# Patient Record
Sex: Male | Born: 1945 | Race: White | Hispanic: No | Marital: Married
Health system: Midwestern US, Academic
[De-identification: ages and names within clinical notes are randomized; demographics above are authoritative.]

---

## 2019-01-04 ENCOUNTER — Encounter: Admit: 2019-01-04 | Discharge: 2019-01-05

## 2019-01-12 ENCOUNTER — Encounter: Admit: 2019-01-12 | Discharge: 2019-01-13

## 2019-01-13 ENCOUNTER — Encounter: Admit: 2019-01-13 | Discharge: 2019-01-14

## 2019-01-24 ENCOUNTER — Encounter: Admit: 2019-01-24 | Discharge: 2019-01-24

## 2019-01-25 ENCOUNTER — Encounter: Admit: 2019-01-25 | Discharge: 2019-01-25

## 2019-01-25 DIAGNOSIS — I10 Essential (primary) hypertension: Secondary | ICD-10-CM

## 2019-01-25 DIAGNOSIS — E785 Hyperlipidemia, unspecified: Secondary | ICD-10-CM

## 2019-01-25 NOTE — Progress Notes
H&E Slides of ACC # [id number], Path Date 01/17/2019, requested on 01/25/2019 from [facility].    Contact for outside path lab is [phone].     UPS shipping label faxed (fax # [phone]) with shipment tracking # [id number].

## 2019-01-25 NOTE — Progress Notes
New encounter opened for Care Everywhere records search.

## 2019-01-25 NOTE — Telephone Encounter
Heme/BMT Navigation Intake Assessment Document    Patient Name:  Robert Williams  DOB:  10-Sep-1945    Date of Referral:  01/23/2019    Diagnosis & Reason for Visit:  Transplant/Treatment Options-Mantle cell lymphoma       [place]-MR:  [id number] APPOINTMENT:  Future Appointments   Date Time Provider Department Center   01/27/2019  8:00 AM Paulene Floor, Phillis Knack, MD [facility] Exam       REFERRING PHYSICIAN:  Al Pimple         FACILITY: [facility]   PHONE:  [phone]                INSURANCE:   Medicare    Allergies: NKA   Family Hx:     Medical Hx: HTN, HLD    Surgical Hx:    Social Hx:  Married  No tobacco history  Alcohol use    HPI:  Patient was seen for consult by ENT Dr Susann Givens 01/17/2019 for enlarged lymph nodes.  Patient had enlarged lymph nodes along the left side of his neck for 2-3 weeks duration.  FNA was performed that day and pathology revealed mantle cell lymphoma.    The patient was referred to [facility] in order to establish care.  Some records can be found in Care Everywhere.  Older records can be found in O2.  Progress note, labs and CT scan have been requested from the patient's PCP, Dr Herschell Dimes in [place].    Timeline of Events:   DATES       01/17/2019 US Impression:  Left cervical lymphadenopathy  Normal base of tongue and larynx   01/17/2019 Pathology-Lymph node left neck  Diagnosis:  Consistent with mantle cell lymphoma   Flow:  Monoclonal B-cells (83% of total cells) expressing CD5 and FMC7 without CD200     Location of Films:  CT scan reports requested 9/1.  Imaging request will be made when reports are received.    Location of Pathology:  MAILED/COURIER and Patient notified outside pathology slides will be obtained for review by [place] pathologist and a facility and professional fee will be billed to their insurance.    NEEDS Assessment:    Genetic Counseling:  Genetic Assessment: Not assessed at this time          Social & Financial: Social and Financial Assessment: Reports adequate support system          Spiritual & Emotional:  Spiritual and Emotional Assessment: Reports adequate support system       Physical:  Fall Risk: None identified       Communication:  Communication Barrier: No       Onc Fertility:   Onc Fertility Assessment: Not applicable       Additional Education:  Additional Education Documented: Yes      Patient Education  COVID-19 guidelines reviewed with patient, including: visitor and universal masking policies, and a temperature check at the facility entrance upon arrival.

## 2019-01-27 ENCOUNTER — Encounter: Admit: 2019-01-27 | Discharge: 2019-01-27

## 2019-01-27 DIAGNOSIS — C8311 Mantle cell lymphoma, lymph nodes of head, face, and neck: Principal | ICD-10-CM

## 2019-01-27 DIAGNOSIS — C61 Malignant neoplasm of prostate: Secondary | ICD-10-CM

## 2019-01-27 DIAGNOSIS — E785 Hyperlipidemia, unspecified: Secondary | ICD-10-CM

## 2019-01-27 DIAGNOSIS — C888 Other malignant immunoproliferative diseases: Secondary | ICD-10-CM

## 2019-01-27 DIAGNOSIS — I10 Essential (primary) hypertension: Secondary | ICD-10-CM

## 2019-01-27 DIAGNOSIS — Z114 Encounter for screening for human immunodeficiency virus [HIV]: Secondary | ICD-10-CM

## 2019-01-27 DIAGNOSIS — R791 Abnormal coagulation profile: Secondary | ICD-10-CM

## 2019-01-27 LAB — CBC AND DIFF
Lab: 0.1 10*3/uL (ref 0–0.20)
Lab: 0.1 10*3/uL (ref 0–0.45)
Lab: 0.6 10*3/uL (ref 0–0.80)
Lab: 1 % (ref 0–2)
Lab: 1 % (ref 0–5)
Lab: 1.1 10*3/uL (ref 1.0–4.8)
Lab: 13 % (ref 11–15)
Lab: 16 % — ABNORMAL LOW (ref >60)
Lab: 279 K/UL (ref 150–400)
Lab: 30 pg (ref 26–34)
Lab: 33 g/dL (ref 32.0–36.0)
Lab: 45 % (ref 40–50)
Lab: 5 M/UL (ref 4.4–5.5)
Lab: 5.2 10*3/uL (ref 1.8–7.0)
Lab: 7.1 10*3/uL (ref 4.5–11.0)
Lab: 73 % (ref 41–77)
Lab: 8 FL (ref 7–11)
Lab: 9 % (ref >60)
Lab: 90 FL (ref 80–100)

## 2019-01-27 LAB — HIV 1& 2 AG-AB SCRN W REFLEX HIV 1 PCR QUANT

## 2019-01-27 LAB — COMPREHENSIVE METABOLIC PANEL
Lab: 140 MMOL/L (ref 137–147)
Lab: 3.9 MMOL/L (ref 3.5–5.1)
Lab: 88 mg/dL (ref 70–100)

## 2019-01-27 LAB — FIBRINOGEN: Lab: 369 mg/dL (ref 200–400)

## 2019-01-27 LAB — HEPATITIS B CORE AB TOT (IGG+IGM)

## 2019-01-27 LAB — PHOSPHORUS: Lab: 2.8 mg/dL — ABNORMAL LOW (ref 2.0–4.5)

## 2019-01-27 LAB — HEPATITIS C ANTIBODY W REFLEX HCV PCR QUANT

## 2019-01-27 LAB — KAPPA/LAMBDA FREE LIGHT CHAINS
Lab: 1.9 mg/dL (ref 0.33–1.94)
Lab: 1.9 mg/dL (ref 0.57–2.63)

## 2019-01-27 LAB — IMMUNOGLOBULINS-IGA,IGG,IGM
Lab: 285 mg/dL — ABNORMAL HIGH (ref 70–390)
Lab: 973 mg/dL — ABNORMAL LOW (ref 762–1488)

## 2019-01-27 LAB — LDH-LACTATE DEHYDROGENASE: Lab: 157 U/L (ref 100–210)

## 2019-01-27 LAB — HEPATITIS B SURFACE AB: Lab: NEGATIVE 10*3/uL — ABNORMAL LOW (ref 1.0–4.8)

## 2019-01-27 LAB — PROTIME INR (PT): Lab: 1.1 K/UL (ref 0.8–1.2)

## 2019-01-27 LAB — PTT (APTT): Lab: 29 s (ref 24.0–36.5)

## 2019-01-27 LAB — D-DIMER: Lab: <21 ng{FEU}/mL (ref <500)

## 2019-01-27 LAB — HEPATITIS B SURFACE AG

## 2019-01-27 LAB — URIC ACID: Lab: 5.7 mg/dL (ref 4.0–8.0)

## 2019-01-27 MED ORDER — LIDOCAINE 1% (BUFFERED) SYR
5 mL | Freq: Once | INTRAMUSCULAR | 0 refills | Status: CN
Start: 2019-01-27 — End: ?

## 2019-01-27 MED ORDER — LORAZEPAM 0.5 MG PO TAB
.5-1 mg | Freq: Once | ORAL | 0 refills | Status: CN | PRN
Start: 2019-01-27 — End: ?

## 2019-01-27 NOTE — Progress Notes
Name: Robert Williams          MRN: [id number]      DOB: Jun 07, 1945      AGE: 73 y.o.   DATE OF SERVICE: 01/27/2019    Subjective:             Reason for Visit:  New CA Pt      Robert Williams is a 73 y.o. male.         Robert Williams presents today for management of lymphoma at the request of Dr. Susann Givens.    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  Presented August 2020 with worsening left supraclavicular adenopathy.  Initial chest x-ray imaging showed pathologically enlarged nodes.  2.  01/13/2019 CT scans of the neck and chest showed bilateral cervical and supraclavicular adenopathy.  3.  01/17/2019 FNA of supraclavicular node revealed CD5-positive monoclonal B-cell population consistent with mantle cell lymphoma.  FISH for t(11;14) was positive, confirming the diagnosis.    On interview today, he feels well.  He does not have any fevers, drenching night sweats, or unintentional weight loss.  He continues to work full-time and has excellent energy.  His significant lymphadenopathy in his neck is not tender or especially symptomatic but is bulky and visually obvious on physical exam.  He denies any other new or concerning symptoms today.    I have reviewed and updated the past medical, social and family histories in the history section and they are up to date as of this visit.    I have extensively reviewed the laboratory, pathology and radiology, both internal and external, and the key findings are summarized above.           Review of Systems   Hematological: Bruises/bleeds easily.   All other systems reviewed and are negative.        Objective:         ??? ascorbate sodium (SODIUM ASCORBATE MISC) Use 3 g as directed five times weekly.   ??? aspirin 325 mg tablet Take 1 Tab by mouth daily.   ??? atorvastatin (LIPITOR) 40 mg tablet Take 1 Tab by mouth daily.   ??? calcium carbonate/vitamin D3 (VITAMIN D-3 PO) Take 1,500 Units by mouth five times weekly.   ??? GARLIC PO Take  by mouth daily. ??? INOSITOL PO Take 400 mg by mouth daily.   ??? losartan-hydrochlorothiazide (HYZAAR) 50-12.5 mg tablet Take 1 tablet by mouth daily.   ??? SELENIUM PO Take 200 mg by mouth daily.   ??? TURMERIC PO Take  by mouth daily.   ??? UBIQUINONE PO Take 100 mg by mouth daily.   ??? valsartan/hydrochlorothiazide (DIOVAN HCT)  80/12.5 mg tablet Take  by mouth daily.     ??? VITAMIN K2 PO Take  by mouth daily.     Vitals:    01/27/19 0757 01/27/19 0758   BP: 131/65    BP Source: Arm, Left Upper    Patient Position: Sitting    Pulse: 80    Resp: 18    Temp: 36.9 ???C (98.5 ???F)    TempSrc: Oral Oral   SpO2: 97%    Weight: 102.5 kg (226 lb) 102.5 kg (226 lb)   Height: 183.5 cm (72.25) 183.5 cm (72.25)   PainSc: Zero Zero     Body mass index is 30.44 kg/m???.     Pain Score: Zero       Fatigue Scale: 0-None    Pain Addressed:  N/A    Patient Evaluated for a Clinical Trial: Patient currently in screening for a treatment clinical trial.     [place] Cooperative Oncology Group performance status is 0, Fully active, able to carry on all pre-disease performance without restriction.[name]     Physical Exam  Vitals signs reviewed.   Constitutional:       General: He is not in acute distress.  HENT:      Head: Normocephalic and atraumatic.   Eyes:      General: No scleral icterus.  Neck:      Musculoskeletal: Neck supple.   Cardiovascular:      Rate and Rhythm: Normal rate and regular rhythm.   Pulmonary:      Effort: Pulmonary effort is normal.      Breath sounds: Normal breath sounds.   Abdominal:      General: There is no distension.      Palpations: Abdomen is soft. There is no mass.   Musculoskeletal: Normal range of motion.   Lymphadenopathy:      Comments: No palpable cervical, supraclavicular, axillary or inguinal adenopathy.   Skin:     General: Skin is warm.      Findings: No rash.   Neurological:      General: No focal deficit present.      Mental Status: He is alert.   Psychiatric:         Mood and Affect: Mood normal. 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 Mantle cell lymphoma, pending definitive staging and management  2. 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 Hypertension  4. Hyperlipidemia  5. ECOG PS 0    Plan:  I had a detailed discussion with [name] and his wife today regarding the natural history, biology, staging, and management of mantle cell lymphoma.  We discussed that this is an uncommon subtype of B-cell non-Hodgkin lymphoma that has overlapping features of aggressive and indolent lymphoma.  While it tends to be externally responsive to treatment and frequently can be put into complete remission, there is a tendency towards recurrence and, absent allogenic stem cell transplant, is generally not considered to be a curable disease.  We additionally discussed with him that there is a small number of patients, perhaps 5-10%, who do not require urgent therapy for their mantle cell lymphoma and can safely be observed.  Depending on the results of his staging work-up, this will help inform whether or not he would be a candidate for observation.  In his particular case, he has bulky lymphadenopathy on exam and I believe that treatment is indicated.  In patients over 70 consolidative autologous stem cell transplant is generally not recommended and, given the effectiveness of CAR T-cell therapy, I would not recommend consolidative autologous stem cell transplant in his case.  Standard of care treatment would be with bendamustine and rituximab for 6 cycles followed by rituximab maintenance for 2 to 3 years.  We do have a clinical trial with a fully chemotherapy free regimen of ibrutinib and venetoclax [facility]) that he would qualify for.  We discussed risks, benefits, and logistics of trial enrollment and he is potentially interested.  We had him meet with the clinical trial coordinator for additional discussion today.    Check PET/CT  Check bone marrow biopsy with conventional cytogenetics, FISH for t(11;14) and deletion 17p, and  neck generation sequencing panel.  Check comprehensive baseline lymphoma labs  RTC with me after staging, or sooner should new or concerning symptoms develop.    I have discussed the diagnosis and treatment plan with the patient and he expresses understanding and wishes to proceed.

## 2019-01-30 ENCOUNTER — Encounter: Admit: 2019-01-30 | Discharge: 2019-01-30

## 2019-01-30 LAB — BETA 2 MICROGLOBULIN: Lab: 2.5 MMOL/L — ABNORMAL HIGH (ref 98–110)

## 2019-01-31 ENCOUNTER — Encounter: Admit: 2019-01-31 | Discharge: 2019-01-31

## 2019-01-31 DIAGNOSIS — C8311 Mantle cell lymphoma, lymph nodes of head, face, and neck: Secondary | ICD-10-CM

## 2019-01-31 LAB — ELECTROPHORESIS-SERUM PROTEIN
Lab: 60 % (ref 48–68)
Lab: 7.1 g/dL (ref 6.0–8.0)
Lab: 9.1 % (ref 5–15)

## 2019-01-31 LAB — IMMUNOFIXATION, SERUM (IFES)

## 2019-01-31 MED ORDER — DEXAMETHASONE 6 MG PO TAB
12 mg | Freq: Once | ORAL | 0 refills | Status: CN
Start: 2019-01-31 — End: ?

## 2019-01-31 MED ORDER — ONDANSETRON HCL 8 MG PO TAB
16 mg | Freq: Once | ORAL | 0 refills | Status: CN
Start: 2019-01-31 — End: ?

## 2019-01-31 MED ORDER — DIPHENHYDRAMINE HCL 25 MG PO CAP
25 mg | Freq: Once | ORAL | 0 refills | Status: CN
Start: 2019-01-31 — End: ?

## 2019-01-31 MED ORDER — BENDAMUSTINE (BENDEKA) IVPB
90 mg/m2 | Freq: Once | INTRAVENOUS | 0 refills | Status: CN
Start: 2019-01-31 — End: ?

## 2019-01-31 MED ORDER — RITUXIMAB-PVVR IVPB
500 mg/m2 | Freq: Once | INTRAVENOUS | 0 refills | Status: CN
Start: 2019-01-31 — End: ?

## 2019-01-31 MED ORDER — ACETAMINOPHEN 325 MG PO TAB
650 mg | Freq: Once | ORAL | 0 refills | Status: CN
Start: 2019-01-31 — End: ?

## 2019-01-31 MED ORDER — MEPERIDINE (PF) 25 MG/ML IJ SYRG
25 mg | INTRAVENOUS | 0 refills | Status: CN | PRN
Start: 2019-01-31 — End: ?

## 2019-02-02 ENCOUNTER — Encounter: Admit: 2019-02-02 | Discharge: 2019-02-02

## 2019-02-03 ENCOUNTER — Encounter: Admit: 2019-02-03 | Discharge: 2019-02-03

## 2019-02-03 ENCOUNTER — Ambulatory Visit: Admit: 2019-02-03 | Discharge: 2019-02-03

## 2019-02-03 DIAGNOSIS — C8311 Mantle cell lymphoma, lymph nodes of head, face, and neck: Secondary | ICD-10-CM

## 2019-02-03 DIAGNOSIS — Z1159 Encounter for screening for other viral diseases: Secondary | ICD-10-CM

## 2019-02-03 DIAGNOSIS — E785 Hyperlipidemia, unspecified: Secondary | ICD-10-CM

## 2019-02-03 DIAGNOSIS — I10 Essential (primary) hypertension: Secondary | ICD-10-CM

## 2019-02-03 DIAGNOSIS — C61 Malignant neoplasm of prostate: Secondary | ICD-10-CM

## 2019-02-03 MED ORDER — CEFAZOLIN INJ 1GM IVP
2 g | Freq: Once | INTRAVENOUS | 0 refills | Status: CN
Start: 2019-02-03 — End: ?

## 2019-02-03 MED ORDER — ONDANSETRON HCL 8 MG PO TAB
8 mg | ORAL_TABLET | ORAL | 3 refills | 8.00000 days | Status: DC | PRN
Start: 2019-02-03 — End: 2019-02-08

## 2019-02-03 NOTE — Progress Notes
Interventional Radiology Outpatient Scheduling Checklist      1.  Name of Procedure(s):   Bone Marrow Biopsy      2.  Date of Procedure:   02/06/2019      3.  Arrival Time:   0800      4.  Procedure Time:  0900      5.  Correct Procedural Room Assignment:  IR [facility] Room #6      6.  Blood Thinners Triaged and instructed per protocol: Y/N/NA:  NA  Confirmed accurate instructions sent to patient: Y/N:  NA       7.  Procedure Order Verified: Y/N:  Yes      9.  Patient instructed to have a driver: Y/N/NA:  Yes    10.  Patient instructed on NPO status: Y/N/NA:  Yes  Midnight and 0700  Confirmed accurate instructions sent to patient: Y/N:  Yes    11.  Specimen needed: Y/N/NA:  Yes   Verified Order placed: Y/N:  Yes    12.  Allergies Verified:  Y/N:  Yes    13.  Is there an Iodine Allergy: Y/N:  No  Does the Procedure Require contrast: Y/N:  NO   If so, was the IR- Contrast Allergy Pre-Procedure Medication protocol ordered: Y/NA:  NA    14.  Does the patient have labs according to IR Pre-procedure Laboratory Parameter policy: Y/N/NA:  Yes  If No, was the patient instructed to obtain labs prior to procedure: Y/N/NA:  NA     15.  Will the patient need to be admitted or have a possible admission: Y/N:  No  If yes, confirmed accurate instructions sent to patient: Y/N/NA:  NA     16.  Patient States Understanding: Y/N:  Yes    17.  History of OSA:  Y/N:  No  If yes, confirm request to bring CPAP sent to patient: Y/N/NA:  NA    18. Patient declines electronic procedure instructions: Y/N:  No

## 2019-02-03 NOTE — Patient Education
Dear Robert Williams,    Thank you for choosing [facility] Interventional Radiology for your procedure. Your appointment information is listed below:    Appointment Date: 02/06/2019  Appointment Time: 9AM  Arrival Time: 8AM  Location:     ? Main Campus: [address] [place], [place]  [address]  Parking: [address]    INTERVENTIONAL RADIOLOGY  PRE-PROCEDURE INSTRUCTIONS SEDATION    You are scheduled for a procedure in Interventional Radiology with procedural sedation.  Please follow these instructions and any direction from your Primary Care/Managing Physician.  If you have questions about your procedure or need to reschedule please call [phone].    Medication Instructions:   You may take the following medications with a small sip of water:  Continue taking your normal morning medications as directed.    Diet Instructions:  a. (8) hours Midnight before your procedure, stop your regular diet and start a clear liquid diet.  b. (6) hours before your procedure, discontinue tube feedings and chewing tobacco.  c. (2) hours 7AM before your procedure discontinue clear liquids.  You should have nothing by mouth. This includes GUM or CANDY.     Clear Liquid Diet    Water  Apple or White Grape Juice  Coffee or tea without cream   Tea  White Cranberry Juice  Chicken Bouillon or Broth (no noodles)  Soda Pop  Popsicles   Beef Bouillon or Broth (no noodles)    Day of Exam Instructions:  1. Bathe or shower with an antibacterial soap prior to your appointment.  2. If you have a history of Obstructive Sleep Apnea (OSA) bring your CPAP/BIPAP.   3. Bring a list of your current medications and the dosages.  4. Wear comfortable clothing and leave valuables at home.  5. Arrive (1) hour prior to your appointment.  This time will be spent registering, interviewing, assessing, educating and preparing you for the test.  ? You will be with [place] anywhere from 30 minutes to 6 hours after your exam depending on your procedure.  6. You may be sedated for the procedure. A responsible adult must drive you home (no Benedetto Goad, taxis or buses are allowed) and stay with you overnight. If you do not have a driver we will be unable to perform your procedure.   7. You will not be able to return to work or drive the same day if receiving sedation.

## 2019-02-03 NOTE — Progress Notes
Name: Robert Williams          MRN: [id number]      DOB: [birth year]      AGE: 72 y.o.   DATE OF SERVICE: 02/03/2019    Subjective:             Reason for Visit:  Follow Up      Robert Williams is a 73 y.o. male.     Cancer Staging  No matching staging information was found for the patient.    History of Present Illness  Robert Williams is a patient of Dr. Mikey Bussing with mantle cell lymphoma.  He is starting treatment with bendamustine and Rituxan on 9/23 and is here for education.    He is doing well with no new complaints.  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  The swelling in his neck is stable.  He is active.  No HEENT, cardiovascular, pulmonary, dermatology, GU or GI complaints.    1.  Presented August 2020 with worsening left supraclavicular adenopathy.  Initial chest x-ray imaging showed pathologically enlarged nodes.  2.  01/13/2019 CT scans of the neck and chest showed bilateral cervical and supraclavicular adenopathy.  3.  01/17/2019 FNA of supraclavicular node revealed CD5-positive monoclonal B-cell population consistent with mantle cell lymphoma.  FISH for t(11;14) was positive, confirming the diagnosis.  ???  He is with his wife.     Review of Systems      Objective:         ??? ascorbate sodium (SODIUM ASCORBATE MISC) Use 3 g as directed five times weekly.   ??? aspirin 325 mg tablet Take 1 Tab by mouth daily.   ??? atorvastatin (LIPITOR) 40 mg tablet Take 1 Tab by mouth daily.   ??? calcium carbonate/vitamin D3 (VITAMIN D-3 PO) Take 1,500 Units by mouth five times weekly.   ??? GARLIC PO Take  by mouth daily.   ??? INOSITOL PO Take 400 mg by mouth daily.   ??? losartan-hydrochlorothiazide (HYZAAR) 50-12.5 mg tablet Take 1 tablet by mouth daily.   ??? ondansetron (ZOFRAN) 8 mg tablet Take one tablet by mouth every 8 hours as needed for Nausea or Vomiting.   ??? SELENIUM PO Take 200 mg by mouth daily.   ??? TURMERIC PO Take  by mouth daily.   ??? UBIQUINONE PO Take 100 mg by mouth daily. ??? valsartan/hydrochlorothiazide (DIOVAN HCT)  80/12.5 mg tablet Take  by mouth daily.     ??? VITAMIN K2 PO Take  by mouth daily.     Vitals:    02/03/19 1021 02/03/19 1022   BP: 118/76    BP Source: Arm, Right Upper    Patient Position: Sitting    Pulse: 79    Resp: 20    Temp: 37.1 ???C (98.8 ???F)    TempSrc: Oral    SpO2: 98%    Weight: 102.1 kg (225 lb)    Height: 183.5 cm (72.25)    PainSc: Zero Zero     Body mass index is 30.3 kg/m???.     Pain Score: Zero         Pain Addressed:  N/A  [place] Cooperative Oncology Group performance status is 0, Fully active, able to carry on all pre-disease performance without restriction.[name]        Physical exam  Vital signs reviewed  Bulky adenopathy in neck.  CBC w/Diff    Lab Results   Component Value Date/Time    WBC 7.1 01/27/2019  09:14 AM    RBC 5.00 01/27/2019 09:14 AM    HGB 15.0 01/27/2019 09:14 AM    HCT 45.5 01/27/2019 09:14 AM    MCV 90.9 01/27/2019 09:14 AM    MCH 30.0 01/27/2019 09:14 AM    MCHC 33.0 01/27/2019 09:14 AM    RDW 13.9 01/27/2019 09:14 AM    PLTCT 279 01/27/2019 09:14 AM    MPV 8.0 01/27/2019 09:14 AM    Lab Results   Component Value Date/Time    NEUT 73 01/27/2019 09:14 AM    ANC 5.20 01/27/2019 09:14 AM    LYMA 16 (L) 01/27/2019 09:14 AM    ALC 1.10 01/27/2019 09:14 AM    MONA 9 01/27/2019 09:14 AM    AMC 0.60 01/27/2019 09:14 AM    EOSA 1 01/27/2019 09:14 AM    AEC 0.10 01/27/2019 09:14 AM    BASA 1 01/27/2019 09:14 AM    ABC 0.10 01/27/2019 09:14 AM             Assessment and Plan:          1. Mantle cell lymphoma.  He is due to have a PET scan, bone marrow biopsy and port placed.  He will start treatment after he sees Dr. Mikey Bussing on 9/23.  Logistics of treatment discussed and everything was answered to his satisfaction.  He will not take blood pressure medication Tuesday evening.  Zofran sent to pharmacy.  Consent signed.  2. He will stop extra supplements during treatment.  3. He will contact interventional radiology regarding bone marrow and Dr. Epifanio Lesches office regarding port placement.  They are anxious about scheduling because his wife is leaving town.  Orders were placed previously.  ???  ???     APP Chemotherapy Education    IV Chemotherapy: The following is a summary of the patient's IV Chemotherapy Education.    A thorough pre-assessment and teaching session explaining the mechanism of action, possible side effects, precautions and instructions regarding bendamustine and Rituxan for curative intent was conducted. The patient will initiate treatment today. The cycle will repeat every 28 days  Plan of administration was reviewed.      Both written and verbal information were given to the patient.    The planned course of treatment, anticipated benefits, material risks and potential side effects that may occur with this course of treatment were explained to the patient.  Side effects and their management were discussed in detail and include, but are not limited to:  Lowering of blood counts, fatigue, taste changes, headaches, reflux, nausea, vomiting, constipation, diarrhea, swelling, hypersensitivity reaction, rash, changes in electrolytes, dehydration, fever      Reproductive concerns were not discussed with the patient  Infertility risks were not applicable and therefore not discussed    Appropriate handling of body secretions and waste at home were reviewed as applicable.    Prescriptions for supportive medications including zofran were e-scripted to their pharmacy and discussed in detail how to take. Drug to drug interactions were reviewed as applicable.     The patient has received contact information for the clinic and was instructed on when and who to call.     The patient verbalized understanding, was given the opportunity to ask questions, and the consent form was signed.       This was a 60 minute face to face encounter with 60 minutes spent in counseling and coordination of care.

## 2019-02-03 NOTE — Telephone Encounter
Pt scheduled for port insert with Dr. Flo Shanks on Tuesday 02/14/19 at [facility].    Instructed use of anesthesia vs. local only would be determined the morning of surgery.  Instructions given, pt states understanding.      Due to upcoming scheduled procedure, the patient has been scheduled for COVID-19 Swab testing to be completed within 48 hours of scheduled procedure.     Appointment Information:  - Date: Saturday 02/11/19  - Time: 1010  Location: [facility]  [address]  [place], [place] [address]      Surgery:      Your surgery will be at the [facility].      Advanced care, conveniently located            Address  [address]  [place], [place] [address]   [phone]  Directions  Located close to [facility], the [address] is just [place] of [address] in [place], [place]. The main entrance to the hospital is on the south side of the building.    Helpful driving directions  From [id number]  ? Take [employer] from [phone].    ? Go north to [employer].   ? Turn left (west) approximately 1/10 of a mile.   ? Turn left (south) into the [address] (formerly [employer]).   ? At stop sign, turn left. Then take the second right (this will be the right turn after the speed bump).    ? The main entrance is located on the south side of the building. Park in any available space.   Parking  Plentiful complimentary parking is just steps away from the front door, located on the south side of the building. Convenient complimentary parking for [facility] patients is located at that facility.    You will need to check into admitting main desk the day of your surgery.  Admitting is located inside the main entrance to the building.  You will receive a call from the surgical team the day prior to your surgery after 2 pm, to inform you of what time you will need to arrive the day of your surgery. -Do not eat or drink (including gum, mints, candy, or chewing tobacco) anything after midnight-12 am the night before your surgery.  -The morning of your surgery brush your teeth and tongue, ok to rinse, but do not drink any water.  -You may take your medications with a sip of water if instructed by your physician to do so.  -Please do not take diabetic medication the morning of your procedure unless otherwise instructed.   -If you are taking blood thinning medications such as Warafin/Coumadin, Clopidogrel/Plavix, or aspirins, please stop taking as instructed prior to the procedure if authorized by your prescribing physician.   -You will need to have someone drive you home.       Please call with any questions or concerns.  Scheduling Bridgett Larsson) [phone]  Nurse line [facility] [place]) [phone]  Fax [phone]

## 2019-02-06 ENCOUNTER — Encounter: Admit: 2019-02-06 | Discharge: 2019-02-06 | Payer: MEDICARE

## 2019-02-06 ENCOUNTER — Ambulatory Visit: Admit: 2019-02-06 | Discharge: 2019-02-06 | Payer: MEDICARE

## 2019-02-06 LAB — CBC W/DIFF BM
Lab: 13 % (ref 11–15)
Lab: 14 g/dL (ref 13.5–16.5)
Lab: 248 10*3/uL (ref 150–400)
Lab: 30 pg (ref 26–34)
Lab: 34 g/dL (ref 32.0–36.0)
Lab: 4.6 M/UL (ref 4.4–5.5)
Lab: 41 % (ref 40–50)
Lab: 6.6 10*3/uL (ref 4.5–11.0)
Lab: 8.5 FL (ref 7–11)
Lab: 88 FL (ref 80–100)

## 2019-02-06 LAB — MANUAL DIFF
Lab: 1 % (ref 0–5)
Lab: 2 % (ref 0–2)
Lab: 22 % — ABNORMAL LOW (ref 24–44)
Lab: 67 % (ref 41–77)
Lab: 8 % (ref 4–12)

## 2019-02-06 MED ORDER — MIDAZOLAM 1 MG/ML IJ SOLN
1 mg | Freq: Once | INTRAVENOUS | 0 refills | Status: CP
Start: 2019-02-06 — End: ?
  Administered 2019-02-06: 14:00:00 1 mg via INTRAVENOUS

## 2019-02-06 MED ORDER — MIDAZOLAM 1 MG/ML IJ SOLN
0 refills | Status: CP
Start: 2019-02-06 — End: ?
  Administered 2019-02-06: 15:00:00 1 mg via INTRAVENOUS

## 2019-02-06 MED ORDER — FENTANYL CITRATE (PF) 50 MCG/ML IJ SOLN
0 refills | Status: CP
Start: 2019-02-06 — End: ?
  Administered 2019-02-06: 15:00:00 50 ug via INTRAVENOUS

## 2019-02-06 NOTE — Progress Notes
Sedation physician present in room.  Recent vitals and patient condition reviewed between sedating physician and nurse.  Reassessment completed.  Determination made to proceed with planned sedation.

## 2019-02-06 NOTE — H&P (View-Only)
Pre Procedure History and Physical/Sedation Plan      Procedure Date:  02/06/2019    Planned Procedure(s): Bone marrow biopsy with moderate sedation     Indication for exam: Access for chemotherapy  ________________________________________________________________    Chief Complaint:   Mantle cell lymphoma     Previous Anesthetic/Sedation History:  Reviewed     Code Status: Full Code     Allergies:  Patient has no known allergies.  Medications:  Scheduled Meds:Continuous Infusions:  PRN and Respiratory Meds:       Vital Signs:  Last Filed Vital Signs: 24 Hour Range           Pre-procedure anxiolysis plan: Midazolam  Sedation/Medication Plan: Fentanyl, Lidocaine and Midazolam  Personal history of sedation complications: Denies adverse event.   Family history of sedation complications: Denies adverse event.   Medications for Reversal: Naloxone and Flumazenil  Discussion/Reviews:  Physician has discussed risks and alternatives of this type of sedation and above planned procedures with patient    NPO Status: Acceptable  Airway:  airway assessment performed  Mallampati II (soft palate, uvula, fauces visible)  Head and Neck: no abnormalities noted  Mouth: no abnormalities noted   Anesthesia Classification:  ASA III (A patient with a severe systemic disease that limits activity, but is not incapacitating)  Pregnancy Status: N/A    Lab/Radiology/Other Diagnostic Tests  Labs:  Relevant labs reviewed    I have examined the patient, and there are no significant changes in their condition, from the previous H&P performed on 9/11.    Robert Honey, APRN-NP  Pager [phone]

## 2019-02-06 NOTE — Other
Immediate Post Procedure Note    Date:  02/06/2019                                         Attending Physician:   Cline Crock, MD  Performing Provider:  Karie Georges, MD    Consent:  Consent obtained from patient.  Time out performed: Consent obtained, correct patient verified, correct procedure verified, correct site verified, patient marked as necessary.  Pre/Post Procedure Diagnosis:  Mantle cell lymphoma  Indications:  Mantle cell lymphoma    Anesthesia: Local lidocaine with IV sedation fentanyl and versed  Procedure(s):  Bone marrow aspiration and biopsy  Findings:  Successful bone marrow aspiration and biopsy     Estimated Blood Loss:  None/Negligible  Specimen(s) Removed/Disposition:  Yes, sent to pathology  Complications: None  Patient Tolerated Procedure: Well  Post-Procedure Condition:  stable    Karie Georges, MD

## 2019-02-06 NOTE — Patient Instructions
Interventional Radiology-Discharge Instructions?  Bone Marrow Aspiration  Bone marrow is the major site of blood cell formation. A bone marrow specimen may be obtained by aspiration or needle biopsy.? Aspiration removes cells through a needle inserted into the marrow cavity of the bone.?? A biopsy removes a small, solid core of marrow tissue through the needle.  ?  Aspiration/Needle Biopsies helps with the diagnosis of leukemia, anemias, and other blood disorders.?  POST-PROCEDURE ACTIVITY:  ? A responsible adult must drive you home.? If you receive sedation or anesthesia, do not drive, operate heavy machinery or do anything that requires concentration for at least 24 hours.?  ? It is recommended that a responsible adult be with you until morning.  ? Keep moving to decrease hip soreness.  ? Avoid any strenuous activity for the next 2-3 days following the procedure.  POST-PROCEDURE SITE CARE:  ? Keep the area clean and dry for 24 hours  ? Be sure your hands are clean when touching near the site.  ? You will have a bandage over the site.? Keep this dry.? You may remove it in 24 hours.?  ? You may shower after the first 24 hours.?? You may replace the previous bandage with a Band-aide if desired.  ? Discomfort at site may occur for 2-3 days following the procedure.  ? You may apply ice to the area; 15-20 minutes.?? May repeat ice every 2 hours as needed.  ? Do not submerge the site underwater for one week (no tub bath, swimming, hot tub, etc.)  ? Do not use ointments, creams or powders on the puncture site.  DIET/MEDICATIONS:  ? You may resume your previous diet after the procedure.?  ? If you receive sedation or anesthesia, avoid any foods or beverages containing alcohol for at least 24 hours.  ? Please see the Medication Reconciliation sheet for instructions regarding resuming your home medications.  ? Drink plenty of fluids for the next 24-48 hours.  WHEN TO CALL THE DOCTOR: ? Bright red blood soaks the bandage.??  ? You have new or worsened pain.? Some soreness is to be expected.  ? You have new or worsened signs of infection such as:  ??????????????? -Redness or swelling  ??????????????? -Fever greater than 101?F  ?  You or your caregiver should call 911 for any severe bleeding, dizziness, shortness of breath or loss of consciousness.  ?  For any of the above symptoms or for problems or concerns related to the procedure,??call? [phone] for Monday-Friday 7-5.? After-hours and weekends, please call??[phone] and ask for the Interventional Radiology Resident on-call.

## 2019-02-06 NOTE — Progress Notes
Bone marrow tech present and provided CBC per this RN.

## 2019-02-07 ENCOUNTER — Encounter: Admit: 2019-02-07 | Discharge: 2019-02-07 | Payer: MEDICARE

## 2019-02-07 LAB — POC GLUCOSE: Lab: 113 mg/dL — ABNORMAL HIGH (ref 70–100)

## 2019-02-07 MED ORDER — RP DX F-18 FDG MCI
10 | Freq: Once | INTRAVENOUS | 0 refills | Status: CP
Start: 2019-02-07 — End: ?
  Administered 2019-02-07: 14:00:00 11.1 via INTRAVENOUS

## 2019-02-08 ENCOUNTER — Encounter: Admit: 2019-02-08 | Discharge: 2019-02-08 | Payer: MEDICARE

## 2019-02-08 ENCOUNTER — Ambulatory Visit: Admit: 2019-02-08 | Discharge: 2019-02-09 | Payer: MEDICARE

## 2019-02-08 NOTE — Progress Notes
Telehealth Visit Note    Date of Service: 02/08/2019    Subjective:      Obtained patient's verbal consent to treat them and their agreement to [facility] financial policy and NPP via this telehealth visit during the [facility]       Robert Williams is a 73 y.o. male.    History of Present Illness  Robert Williams is a very pleasant 73 yo male who presents for initial evaluation for placement of central venous access.  The patient has been diagnosed with mantel cell lymphoma and is scheduled to begin chemotherapy on 02/15/2019.  He is in need of central venous access port and has been referred to Dr. Elisabeth Most.  The patient denies any previous upper chest operations or previous ports in the past.  He has a history of prostate cancer and has undergone resection and radiation.      Medical History:   Diagnosis Date   ? Cancer of prostate [facility])    ? Hyperlipidemia 08/13/2011   ? Hypertension 08/13/2011     Surgical History:   Procedure Laterality Date   ? PROSTATE SURGERY  2003   ? COLONOSCOPY       Family History   Problem Relation Age of Onset   ? Heart Disease Father      Social History     Socioeconomic History   ? Marital status: Married     Spouse name: Not on file   ? Number of children: Not on file   ? Years of education: Not on file   ? Highest education level: Not on file   Occupational History   ? Not on file   Tobacco Use   ? Smoking status: Never Smoker   ? Smokeless tobacco: Never Used   Substance and Sexual Activity   ? Alcohol use: Never     Frequency: Never     Comment: occasionally   ? Drug use: No   ? Sexual activity: Not on file   Other Topics Concern   ? Not on file   Social History Narrative   ? Not on file              Review of Systems   Constitutional: Negative.    HENT: Negative.    Eyes: Negative.    Respiratory: Negative.    Cardiovascular: Negative.    Gastrointestinal: Negative.    Endocrine: Negative.    Genitourinary: Positive for flank pain. Musculoskeletal: Positive for back pain.   Skin: Negative.    Allergic/Immunologic: Negative.    Neurological: Negative.    Hematological: Negative.    Psychiatric/Behavioral: Negative.          Objective:         ? ascorbate sodium (SODIUM ASCORBATE MISC) Use 3 g as directed five times weekly.   ? aspirin 325 mg tablet Take 1 Tab by mouth daily.   ? atorvastatin (LIPITOR) 40 mg tablet Take 1 Tab by mouth daily.   ? calcium carbonate/vitamin D3 (VITAMIN D-3 PO) Take 1,500 Units by mouth five times weekly.   ? GARLIC PO Take  by mouth daily.   ? INOSITOL PO Take 400 mg by mouth daily.   ? losartan-hydrochlorothiazide (HYZAAR) 50-12.5 mg tablet Take 1 tablet by mouth daily.   ? ondansetron (ZOFRAN) 8 mg tablet Take one tablet by mouth every 8 hours as needed for Nausea or Vomiting.   ? SELENIUM PO Take 200 mg by mouth daily.   ?  TURMERIC PO Take  by mouth daily.   ? UBIQUINONE PO Take 100 mg by mouth daily.   ? valsartan/hydrochlorothiazide (DIOVAN HCT)  80/12.5 mg tablet Take  by mouth daily.     ? VITAMIN K2 PO Take  by mouth daily.     Vitals:    02/08/19 0902   Weight: 102.1 kg (225 lb)   Height: 183.5 cm (72.25)   PainSc: Zero     Body mass index is 30.3 kg/m?[name]     Telehealth Patient Reported Vitals     Row Name 02/08/19 0902                Pain Score  Zero              Physical Exam  General:  73 yo M in no acute distress  Lungs: Respirations sound even and unlabored, able to speak in sentences without SOB, no cough or dyspnea observed  Neurological: Alert and oriented x 3, speech is clear and fluent,   Psych: Mood and affect are normal       Assessment and Plan:  73 yo M with mantel cell lymphoma and is scheduled to begin chemotherapy on 02/15/2019.  He is in need of central venous access port and has been referred to Dr. Elisabeth Most    We will proceed with placement of LEFT chest port a cath    We spent 30 minutes on the phone going over past medical and surgical history, performing an exam and discussing educational materials in great detail.  The patient is scheduled to begin chemotherapy soon and is in need of central venous access port.  Risks vs benefits of port placement were discussed in detail including bleeding, possible infection, damage to blood vessel or nerves, and pneumothorax. The patient wishes to electively proceed.  Post operative instructions and education were provided today, and written instructions were given to him in her My Chart.  The patient was provided the opportunity to have all of his questions and concerns addressed to his satisfaction and we will proceed with port placement on 02/14/2019 at the [facility].  He will call our office in the meantime with any follow up questions or problems.  The patient was instructed to stop is 325 mg ASA today and will resume the day after surgery.    Luna Glasgow, APRN-BC  Pager [phone]    [facility]  Department of General Surgery  [facility]  [address]  [place], [place]  [address]                             24 minutes spent on this patient's encounter with counseling and coordination of care taking >50% of the visit.

## 2019-02-08 NOTE — Patient Instructions
Department of General Surgery  Discharge Instructions  Implanted Port Placement    An implanted port is a small intravenous access device placed completely under the skin through which you may receive chemotherapy, other medications, and or blood products.  It may also be used to draw blood.   The port consists of a small reservoir that is implanted  under the skin of your chest.  The reservoir is connected to a catheter that is placed in a ?tunnel? under the skin and ends in a large vein near the center of your chest.  You may also hear it referred to as a ?power port? or ?portacath.?  A ?power port? is a port that can be used for contrast injections for CT scans.  A ?power port? is also safe for MRIs should you need one.    POST-PROCEDURE ACTIVITY  ? A responsible adult must drive you home.  After receiving sedation or anesthesia, you should not drive, operate heavy machinery or do anything that requires concentration for at least 24 hours after receiving sedation.  ? It is recommended that a responsible adult be with you until morning  ? Do not lift more than 10 lbs and avoid any strenuous activity affecting the upper body such as pushing, pulling or straining for 7 days.    POST-PROCEDURE SITE CARE  ? Your incision will be closed from the inside out.  You will not have any sutures that will need to be removed on the outside.  There will be topical skin glue (Dermabond) on the outside.  This is the shiny, lavender color substance that you see over the incision.  This will come off on its own.  Do not pick at it unless it remains two weeks after your procedure, then you can remove it.   ? You may shower in 24 hours.  Let warm soapy water run over your incision, then pat it dry gently.  ? Do not submerge the area underwater for 2 weeks or until fully healed (no swimming, hot tubs, bath tubs, etc)  ? Be sure your hands are clean when touching the area.  ? Do not use ointments, creams or powders on the incision. DIET/MEDICATIONS  ? You may resume your previous diet after the procedure.  ? Please avoid any foods or beverages containing alcohol for at least 24 hours after the procedure due to sedation.  ? Please see your medication reconciliation sheet regarding medications to resume after procedure (this will be given to you the day of the procedure)    WHEN TO CALL THE DOCTOR  ? Bright red blood coming from the wound.  ? You have pain not relieved by the medication.  Some soreness at the site is to be expected.  You may use cool packs or heat to help relieve pain, but do not let the cool packs or heat have direct contact with the skin/incision.  ? You have signs or symptoms of infections such as:  o Chills, body aches, fever greater than 101?F  o Redness, swelling or warmth at  or around your incision  o Drainage or pus coming from the incision  o Increased tenderness around the incision  ? You have an opening of the edge of the incision  ? You have swelling or the face, neck, chest or arm on the side where the port was place.    For severe problems such as excessive bleeding, chest pain or shortness of breath, please call 911.  For the above problems or other concerns related to the procedure, please call [phone].    For appointment scheduling questions or concerns, please call [phone].

## 2019-02-11 ENCOUNTER — Encounter: Admit: 2019-02-11 | Discharge: 2019-02-12 | Payer: MEDICARE

## 2019-02-12 ENCOUNTER — Encounter: Admit: 2019-02-12 | Discharge: 2019-02-12 | Payer: MEDICARE

## 2019-02-13 ENCOUNTER — Encounter: Admit: 2019-02-13 | Discharge: 2019-02-13 | Payer: MEDICARE

## 2019-02-13 LAB — COVID-19 (SARS-COV-2) PCR

## 2019-02-13 MED ORDER — ONDANSETRON HCL 8 MG PO TAB
ORAL_TABLET | Freq: Three times a day (TID) | ORAL | 3 refills | 8.00000 days | Status: AC | PRN
Start: 2019-02-13 — End: ?

## 2019-02-14 ENCOUNTER — Ambulatory Visit: Admit: 2019-02-14 | Discharge: 2019-02-14 | Payer: MEDICARE

## 2019-02-14 ENCOUNTER — Encounter: Admit: 2019-02-14 | Discharge: 2019-02-14 | Payer: MEDICARE

## 2019-02-14 MED ORDER — LACTATED RINGERS IV SOLP
1000 mL | INTRAVENOUS | 0 refills | Status: DC
Start: 2019-02-14 — End: 2019-02-14
  Administered 2019-02-14: 15:00:00 1000 mL via INTRAVENOUS

## 2019-02-14 MED ORDER — LIDOCAINE-EPINEPHRINE 1 %-1:100,000 IJ SOLN
0 refills | Status: DC
Start: 2019-02-14 — End: 2019-02-14
  Administered 2019-02-14: 19:00:00 10 mL via INTRAMUSCULAR

## 2019-02-14 MED ORDER — PROPOFOL INJ 10 MG/ML IV VIAL
0 refills | Status: DC
Start: 2019-02-14 — End: 2019-02-14
  Administered 2019-02-14: 18:00:00 30 mg via INTRAVENOUS
  Administered 2019-02-14: 19:00:00 20 mg via INTRAVENOUS

## 2019-02-14 MED ORDER — CEFAZOLIN INJ 1GM IVP
2 g | Freq: Once | INTRAVENOUS | 0 refills | Status: CP
Start: 2019-02-14 — End: ?
  Administered 2019-02-14: 19:00:00 2 g via INTRAVENOUS

## 2019-02-14 MED ORDER — FENTANYL CITRATE (PF) 50 MCG/ML IJ SOLN
50 ug | INTRAVENOUS | 0 refills | Status: DC | PRN
Start: 2019-02-14 — End: 2019-02-14

## 2019-02-14 MED ORDER — ONDANSETRON HCL (PF) 4 MG/2 ML IJ SOLN
4 mg | Freq: Once | INTRAVENOUS | 0 refills | Status: DC | PRN
Start: 2019-02-14 — End: 2019-02-14

## 2019-02-14 MED ORDER — ONDANSETRON HCL (PF) 4 MG/2 ML IJ SOLN
0 refills | Status: DC
Start: 2019-02-14 — End: 2019-02-14
  Administered 2019-02-14: 19:00:00 4 mg via INTRAVENOUS

## 2019-02-14 MED ORDER — PROPOFOL 10 MG/ML IV EMUL 50 ML (INFUSION)(AM)(OR)
INTRAVENOUS | 0 refills | Status: DC
Start: 2019-02-14 — End: 2019-02-14
  Administered 2019-02-14: 18:00:00 130 ug/kg/min via INTRAVENOUS

## 2019-02-14 MED ORDER — HEPARIN 5,000 UNITS IN NS 500 ML IRR SOLN (OR)
0 refills | Status: DC
Start: 2019-02-14 — End: 2019-02-14
  Administered 2019-02-14 (×2): 4 mL

## 2019-02-14 MED ORDER — OXYCODONE 5 MG PO TAB
5 mg | ORAL_TABLET | ORAL | 0 refills | 6.00000 days | Status: DC | PRN
Start: 2019-02-14 — End: 2019-05-10

## 2019-02-14 MED ORDER — HALOPERIDOL LACTATE 5 MG/ML IJ SOLN
1 mg | Freq: Once | INTRAVENOUS | 0 refills | Status: DC | PRN
Start: 2019-02-14 — End: 2019-02-14

## 2019-02-14 MED ORDER — PROMETHAZINE 25 MG/ML IJ SOLN
6.25 mg | INTRAVENOUS | 0 refills | Status: DC | PRN
Start: 2019-02-14 — End: 2019-02-14

## 2019-02-14 MED ORDER — FENTANYL CITRATE (PF) 50 MCG/ML IJ SOLN
0 refills | Status: DC
Start: 2019-02-14 — End: 2019-02-14
  Administered 2019-02-14: 18:00:00 100 ug via INTRAVENOUS

## 2019-02-14 MED ORDER — OXYCODONE-ACETAMINOPHEN 5-325 MG PO TAB
1-2 | Freq: Once | ORAL | 0 refills | Status: DC | PRN
Start: 2019-02-14 — End: 2019-02-14

## 2019-02-14 MED ORDER — LIDOCAINE (PF) 10 MG/ML (1 %) IJ SOLN
.1-2 mL | INTRAMUSCULAR | 0 refills | Status: DC | PRN
Start: 2019-02-14 — End: 2019-02-14

## 2019-02-14 MED ORDER — FENTANYL CITRATE (PF) 50 MCG/ML IJ SOLN
25 ug | INTRAVENOUS | 0 refills | Status: DC | PRN
Start: 2019-02-14 — End: 2019-02-14

## 2019-02-14 MED ORDER — LIDOCAINE (PF) 200 MG/10 ML (2 %) IJ SYRG
0 refills | Status: DC
Start: 2019-02-14 — End: 2019-02-14
  Administered 2019-02-14: 18:00:00 40 mg via INTRAVENOUS

## 2019-02-14 MED ORDER — MEPERIDINE (PF) 25 MG/ML IJ SYRG
12.5 mg | INTRAVENOUS | 0 refills | Status: DC | PRN
Start: 2019-02-14 — End: 2019-02-14

## 2019-02-14 MED ORDER — MIDAZOLAM 1 MG/ML IJ SOLN
INTRAVENOUS | 0 refills | Status: DC
Start: 2019-02-14 — End: 2019-02-14
  Administered 2019-02-14: 18:00:00 2 mg via INTRAVENOUS

## 2019-02-14 NOTE — Anesthesia Post-Procedure Evaluation
Post-Anesthesia Evaluation    Name: Robert Williams      MRN: [id number]     DOB: [birth year]     Age: 73 y.o.     Sex: male   __________________________________________________________________________     Procedure Information     Anesthesia Start Date/Time:  02/14/19 1325    Procedures:       CHEST PLACEMENT OF PORT-A-CATH SINGLE-LUMEN 8 FRENCH (Left Chest)      FLUOROSCOPIC GUIDANCE CENTRAL VENOUS ACCESS DEVICE PLACEMENT (Left Chest)    Location:  [facility] OR 4 / [place] MAIN OR/PERIOP    Surgeon:  Elmarie Shiley., MD          Post-Anesthesia Vitals  BP: 108/69 (09/22 1430)  Temp: 36.8 C (98.2 F) (09/22 1409)  Pulse: 72 (09/22 1430)  Respirations: 19 PER MINUTE (09/22 1430)  SpO2: 94 % (09/22 1430)  SpO2 Pulse: 76 (09/22 1430)  Height: 182.9 cm (72") (09/22 1008)   Vitals Value Taken Time   BP 108/69 02/14/2019  2:30 PM   Temp 36.8 C (98.2 F) 02/14/2019  2:09 PM   Pulse 72 02/14/2019  2:30 PM   Respirations 19 PER MINUTE 02/14/2019  2:30 PM   SpO2 94 % 02/14/2019  2:30 PM         Post Anesthesia Evaluation Note    Evaluation location: pre/post  Patient participation: recovered; patient participated in evaluation  Level of consciousness: alert    Pain score: 4  Pain management: adequate    Hydration: normovolemia  Temperature: 36.0C - 38.4C  Airway patency: adequate    Perioperative Events       Post-op nausea and vomiting: no PONV    Postoperative Status  Cardiovascular status: hemodynamically stable  Respiratory status: spontaneous ventilation        Perioperative Events  Perioperative Event: No  Emergency Case Activation: No

## 2019-02-15 ENCOUNTER — Encounter: Admit: 2019-02-15 | Discharge: 2019-02-15 | Payer: MEDICARE

## 2019-02-15 LAB — CBC AND DIFF
Lab: 0.1 10*3/uL (ref 0–0.20)
Lab: 4.6 M/UL (ref 4.4–5.5)
Lab: 7.4 10*3/uL (ref 4.5–11.0)

## 2019-02-15 LAB — COMPREHENSIVE METABOLIC PANEL
Lab: 0.4 mg/dL (ref 0.3–1.2)
Lab: 103 MMOL/L — ABNORMAL LOW (ref >60)
Lab: 120 mg/dL — ABNORMAL HIGH (ref >60)
Lab: 13 U/L (ref 7–56)
Lab: 138 MMOL/L (ref 137–147)
Lab: 16 U/L (ref 7–40)
Lab: 17 mg/dL (ref 7–25)
Lab: 26 MMOL/L (ref 21–30)
Lab: 3.9 MMOL/L (ref 3.5–5.1)
Lab: 4.1 g/dL — ABNORMAL LOW (ref 3.5–5.0)
Lab: 69 U/L (ref 25–110)
Lab: 7.1 g/dL (ref 6.0–8.0)
Lab: 9 10*3/uL (ref 3–12)
Lab: 9.7 mg/dL (ref 8.5–10.6)
Lab: >60 mL/min (ref >60)
Lab: >60 mL/min (ref >60)

## 2019-02-15 MED ORDER — RITUXIMAB-PVVR IVPB
500 mg/m2 | Freq: Once | INTRAVENOUS | 0 refills | Status: CP
Start: 2019-02-15 — End: ?
  Administered 2019-02-15 (×3): 1100 mg via INTRAVENOUS

## 2019-02-15 MED ORDER — DEXAMETHASONE 6 MG PO TAB
12 mg | Freq: Once | ORAL | 0 refills | Status: CN
Start: 2019-02-15 — End: ?

## 2019-02-15 MED ORDER — RITUXIMAB-PVVR IVPB
500 mg/m2 | Freq: Once | INTRAVENOUS | 0 refills | Status: CN
Start: 2019-02-15 — End: ?

## 2019-02-15 MED ORDER — ONDANSETRON HCL 8 MG PO TAB
16 mg | Freq: Once | ORAL | 0 refills | Status: CN
Start: 2019-02-15 — End: ?

## 2019-02-15 MED ORDER — BENDAMUSTINE (BENDEKA) IVPB
90 mg/m2 | Freq: Once | INTRAVENOUS | 0 refills | Status: CN
Start: 2019-02-15 — End: ?

## 2019-02-15 MED ORDER — METHYLPREDNISOLONE SOD SUC(PF) 125 MG/2 ML IJ SOLR
125 mg | Freq: Once | INTRAVENOUS | 0 refills | Status: CP
Start: 2019-02-15 — End: ?
  Administered 2019-02-15: 14:00:00 125 mg via INTRAVENOUS

## 2019-02-15 MED ORDER — ACETAMINOPHEN 325 MG PO TAB
650 mg | Freq: Once | ORAL | 0 refills | Status: CN
Start: 2019-02-15 — End: ?

## 2019-02-15 MED ORDER — ACETAMINOPHEN 325 MG PO TAB
650 mg | Freq: Once | ORAL | 0 refills | Status: CP
Start: 2019-02-15 — End: ?
  Administered 2019-02-15: 14:00:00 650 mg via ORAL

## 2019-02-15 MED ORDER — BENDAMUSTINE (BENDEKA) IVPB
90 mg/m2 | Freq: Once | INTRAVENOUS | 0 refills | Status: CP
Start: 2019-02-15 — End: ?
  Administered 2019-02-15 (×2): 200 mg via INTRAVENOUS

## 2019-02-15 MED ORDER — MEPERIDINE (PF) 25 MG/ML IJ SYRG
25 mg | INTRAVENOUS | 0 refills | Status: DC | PRN
Start: 2019-02-15 — End: 2019-02-20

## 2019-02-15 MED ORDER — DIPHENHYDRAMINE HCL 25 MG PO CAP
25 mg | Freq: Once | ORAL | 0 refills | Status: CN
Start: 2019-02-15 — End: ?

## 2019-02-15 MED ORDER — ONDANSETRON HCL 8 MG PO TAB
16 mg | Freq: Once | ORAL | 0 refills | Status: CP
Start: 2019-02-15 — End: ?
  Administered 2019-02-15: 14:00:00 16 mg via ORAL

## 2019-02-15 MED ORDER — MONTELUKAST 10 MG PO TAB
10 mg | Freq: Once | ORAL | 0 refills | Status: CP
Start: 2019-02-15 — End: ?
  Administered 2019-02-15: 14:00:00 10 mg via ORAL

## 2019-02-15 MED ORDER — DIPHENHYDRAMINE HCL 50 MG/ML IJ SOLN
50 mg | Freq: Once | INTRAVENOUS | 0 refills | Status: CP
Start: 2019-02-15 — End: ?
  Administered 2019-02-15: 14:00:00 50 mg via INTRAVENOUS

## 2019-02-15 NOTE — Patient Instructions
Call Immediately to report the following:  Uncontrolled nausea and/or vomiting, uncontrolled pain, or unusual bleeding.  Temperature of 100.4 F or greater and/or any sign/symptom of infection (redness, warmth, tenderness)  Painful mouth or difficulty swallowing  Red, cracked, or painful hands and/or feet  Diarrhea   Swelling of arms or legs  Rash    Important Phone Numbers:  [facility] Main Number (answered 24 hours a day) [phone]  Cancer Center Scheduling (appointments) [phone] OR [phone]  [facility] (for nutritional supplements) [phone]        Port Maintenance - If you have a port, it should be flushed every 6-8 weeks when not in use.  Please check with your MD, nurse, or the scheduler.

## 2019-02-15 NOTE — Progress Notes
Cycle 1 Day 1 Rituximab Bendamustine    Patient had port place yesterday. Site appears red and inflamed.   Will continue to monitor but use peripheral IV today.  Patient verbalized understanding.    Rituxan titrated per protocol and tolerated without incident.  Bendeka given per plan.  Discharged in good condition, ambulatory.    CHEMO NOTE    Labs/applicable tests checked: CBC, CMP  Verified chemo consent signed and in chart.  BSA and dose double checked (agree with orders as written).    Arm band verified at bedside with second RN.  Premedications/Prehydration given as ordered.  Chemo drug/dose/route: see MAR  Rate verified with second RN.    Patient education offered and stated understanding.

## 2019-02-15 NOTE — Progress Notes
Name: Robert Williams          MRN: [id number]      DOB: 1946-05-10      AGE: 73 y.o.   DATE OF SERVICE: 02/15/2019    Subjective:             Reason for Visit:  Follow Up      Robert Williams is a 73 y.o. male.       Robert Williams presents today for management of lymphoma at the request of Dr. Susann Givens.    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  Presented August 2020 with worsening left supraclavicular adenopathy.  Initial chest x-ray imaging showed pathologically enlarged nodes.  2.  01/13/2019 CT scans of the neck and chest showed bilateral cervical and supraclavicular adenopathy.  3.  01/17/2019 FNA of supraclavicular node revealed CD5-positive monoclonal B-cell population consistent with mantle cell lymphoma.  FISH for t(11;14) was positive, confirming the diagnosis.  4.  02/07/2019 PET/CT revealed hypermetabolic adenopathy above and below the diaphragm with no extranodal sites of disease.  Staging bone marrow biopsy was negative.  5.  02/15/2019 BR started    Interim History:  Some erythema at his port site.  There is no significant pain.  He has not had any fevers.  Ready to get going with treatment.  He notes ongoing adenopathy in his neck that remains nontender but is clearly evident on inspection.    He continues to work full-time and has excellent energy.  No fevers, drenching night sweats, unintentional weight loss.  He denies any other new or concerning symptoms today.    I have reviewed and updated the past medical, social and family histories in the history section and they are up to date as of this visit.    I have extensively reviewed the laboratory, pathology and radiology, both internal and external, and the key findings are summarized above.           Review of Systems   All other systems reviewed and are negative.        Objective:         ? aspirin 325 mg tablet Take 1 Tab by mouth daily. ? atorvastatin (LIPITOR) 40 mg tablet Take 1 Tab by mouth daily. (Patient taking differently: Take 40 mg by mouth at bedtime daily.)   ? losartan-hydrochlorothiazide (HYZAAR) 50-12.5 mg tablet Take 1 tablet by mouth daily.   ? ondansetron (ZOFRAN) 8 mg tablet Take 1 tablet by mouth every 8 hours as needed for nasuea/vomitting.   ? oxyCODONE (ROXICODONE) 5 mg tablet Take one tablet by mouth every 4 hours as needed for Pain Indications: pain     Vitals:    02/15/19 0825   BP: 136/65   BP Source: Arm, Right Upper   Patient Position: Sitting   Pulse: 77   Resp: 18   Temp: 36.8 ?C (98.2 ?F)   TempSrc: Oral   SpO2: 98%   Weight: 102.9 kg (226 lb 12.8 oz)   Height: 182.9 cm (72)   PainSc: Zero     Body mass index is 30.76 kg/m?[name]     Pain Score: Zero       Fatigue Scale: 0-None    Pain Addressed:  N/A    Patient Evaluated for a Clinical Trial: Discussed clinical trial evaluation with patient and patient declines     [place] Cooperative Oncology Group performance status is 0, Fully active, able to  carry on all pre-disease performance without restriction.     Physical Exam  Vitals signs reviewed.   Constitutional:       General: He is not in acute distress.  HENT:      Head: Normocephalic and atraumatic.   Eyes:      General: No scleral icterus.  Neck:      Musculoskeletal: Neck supple.   Cardiovascular:      Rate and Rhythm: Normal rate and regular rhythm.   Pulmonary:      Effort: Pulmonary effort is normal.      Breath sounds: Normal breath sounds.   Abdominal:      General: There is no distension.      Palpations: Abdomen is soft. There is no mass.   Musculoskeletal: Normal range of motion.   Lymphadenopathy:      Comments:   Bulky bilateral cervical nodes   Skin:     General: Skin is warm.      Findings: No rash.   Neurological:      General: No focal deficit present.      Mental Status: He is alert.   Psychiatric:         Mood and Affect: Mood normal.               Assessment and Plan:      Problem Mantle Cell Lymphoma of Lymph Nodes of Neck (Hcc)    Impression:  1. Stage III mantle cell lymphoma  2. 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 Hypertension  4. Hyperlipidemia  5. ECOG PS 0    Plan:  Since last visit, there have been multiple events.  First, he had a PET/CT that I reviewed personally which reveals multifocal adenopathy above and below the diaphragm consistent with stage III disease.  Staging bone marrow biopsy was negative.  He will be treated per advanced age protocol as was thought at his initial visit.  Second, he had carefully considered enrollment in a clinical study and elected to go with standard of care with BR followed by rituximab maintenance.  He has had appropriate education, undergone port placement, and is prepared to initiate therapy today.    Start BR with C1D1 = 02/15/2019.  He will receive 6 cycles of therapy followed by 2 years of rituximab maintenance therapy.  His adenopathy is obvious on physical exam.  Assuming he has a clear clinical response, we will plan on repeating a PET/CT at completion of BR therapy.  Due to her history of lymphoma, he has an elevated risk of infections and secondary malignancies.  I advised him to have an annual influenza vaccine, remain up-to-date with pneumococcal vaccinations, and receive the new shingles vaccine, Shingrix.  He additionally should have aggressive age-appropriate cancer screening.  We will investigate his vaccination history and initiate any indicated pneumococcal vaccinations or shingles vaccines after he has some immune reconstitution after BR.  RTC with me in 2 weeks, or sooner should new or concerning symptoms develop.    I have discussed the diagnosis and treatment plan with the patient and he expresses understanding and wishes to proceed.

## 2019-02-16 ENCOUNTER — Encounter: Admit: 2019-02-16 | Discharge: 2019-02-16 | Payer: MEDICARE

## 2019-02-16 MED ORDER — ONDANSETRON HCL 8 MG PO TAB
16 mg | Freq: Once | ORAL | 0 refills | Status: CP
Start: 2019-02-16 — End: ?
  Administered 2019-02-16: 14:00:00 16 mg via ORAL

## 2019-02-16 MED ORDER — DEXAMETHASONE 6 MG PO TAB
12 mg | Freq: Once | ORAL | 0 refills | Status: CP
Start: 2019-02-16 — End: ?
  Administered 2019-02-16: 14:00:00 12 mg via ORAL

## 2019-02-16 MED ORDER — BENDAMUSTINE (BENDEKA) IVPB
90 mg/m2 | Freq: Once | INTRAVENOUS | 0 refills | Status: CP
Start: 2019-02-16 — End: ?
  Administered 2019-02-16 (×2): 200 mg via INTRAVENOUS

## 2019-02-16 NOTE — Progress Notes
Cycle 1 Day 2 Bendeka    Patient c/o constipation and will take Miralax when he gets home since stool softener hasn't worked yet.  Encouraged him to repeat tonight if no results.    Tx completed per plan.  Discharged to wife in good condition, ambulatory.    CHEMO NOTE    Labs/applicable tests checked: CBC, CMP  Verified chemo consent signed and in chart.  BSA and dose double checked (agree with orders as written).    Arm band verified at bedside with second RN.  Premedications/Prehydration given as ordered.  Chemo drug/dose/route: see MAR  Rate verified with second RN.    Patient education offered and stated understanding.

## 2019-02-20 ENCOUNTER — Encounter: Admit: 2019-02-20 | Discharge: 2019-05-05 | Payer: MEDICARE

## 2019-03-01 LAB — COMPREHENSIVE METABOLIC PANEL
Lab: 0.4 mg/dL — ABNORMAL HIGH (ref 0.3–1.2)
Lab: 0.8 mg/dL (ref 0.4–1.24)
Lab: 10 K/UL — ABNORMAL LOW (ref 3–12)
Lab: 103 MMOL/L (ref 98–110)
Lab: 103 mg/dL — ABNORMAL HIGH (ref 70–100)
Lab: 139 MMOL/L (ref 137–147)
Lab: 16 U/L (ref 7–40)
Lab: 16 U/L (ref 7–56)
Lab: 16 mg/dL (ref 7–25)
Lab: 26 MMOL/L (ref 21–30)
Lab: 4 MMOL/L (ref 3.5–5.1)
Lab: 4.1 g/dL — ABNORMAL LOW (ref 3.5–5.0)
Lab: 6.8 g/dL (ref 6.0–8.0)
Lab: 63 U/L (ref 25–110)
Lab: 9.8 mg/dL (ref 8.5–10.6)
Lab: >60 mL/min (ref >60)
Lab: >60 mL/min (ref >60)

## 2019-03-01 LAB — CBC AND DIFF
Lab: 0.1 10*3/uL (ref 0–0.20)
Lab: 4.7 M/UL (ref 4.4–5.5)
Lab: 6.7 10*3/uL (ref 4.5–11.0)

## 2019-03-01 NOTE — Progress Notes
Name: Robert Williams          MRN: [id number]      DOB: 1946-02-08      AGE: 73 y.o.   DATE OF SERVICE: 03/01/2019    Subjective:             Reason for Visit:  Follow Up      Robert Williams is a 73 y.o. male.     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 Susann Givens.    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  Presented August 2020 with worsening left supraclavicular adenopathy.  Initial chest x-ray imaging showed pathologically enlarged nodes.  2.  01/13/2019 CT scans of the neck and chest showed bilateral cervical and supraclavicular adenopathy.  3.  01/17/2019 FNA of supraclavicular node revealed CD5-positive monoclonal B-cell population consistent with mantle cell lymphoma.  FISH for t(11;14) was positive, confirming the diagnosis.  4.  02/07/2019 PET/CT revealed hypermetabolic adenopathy above and below the diaphragm with no extranodal sites of disease.  Staging bone marrow biopsy was negative.  5.  02/15/2019 BR started    Interim History:  Doing very well.  Nodes all smaller.  Some mild constipation days 4 and 5, resolved with MOM.  No nausea or vomiting.  No fevers or infections.  Continues to work full-time and has excellent energy.  No fevers, drenching night sweats, unintentional weight loss.    I have reviewed and updated the past medical, social and family histories in the history section and they are up to date as of this visit.    I have extensively reviewed the laboratory, pathology and radiology, both internal and external, and the key findings are summarized above.           Review of Systems   All other systems reviewed and are negative.        Objective: ? aspirin 325 mg tablet Take 1 Tab by mouth daily.   ? atorvastatin (LIPITOR) 40 mg tablet Take 1 Tab by mouth daily. (Patient taking differently: Take 40 mg by mouth at bedtime daily.)   ? losartan-hydrochlorothiazide (HYZAAR) 50-12.5 mg tablet Take 1 tablet by mouth daily.   ? ondansetron (ZOFRAN) 8 mg tablet Take 1 tablet by mouth every 8 hours as needed for nasuea/vomitting.   ? oxyCODONE (ROXICODONE) 5 mg tablet Take one tablet by mouth every 4 hours as needed for Pain Indications: pain     Vitals:    03/01/19 1014   BP: 125/68   BP Source: Arm, Left Upper   Patient Position: Sitting   Pulse: 75   Resp: 18   Temp: 36.7 ?C (98 ?F)   TempSrc: Oral   SpO2: 99%   Weight: 101.1 kg (222 lb 12.8 oz)   Height: 182.9 cm (72)   PainSc: Zero     Body mass index is 30.22 kg/m?[name]     Pain Score: Zero       Fatigue Scale: 0-None    Pain Addressed:  N/A    Patient Evaluated for a Clinical Trial: Patient not eligible for a treatment trial (including not needing treatment, needs  palliative care, in remission).     [place] Cooperative Oncology Group performance status is 0, Fully active, able to carry on all pre-disease performance without restriction.[name]     Physical Exam  Vitals signs reviewed.   Constitutional:       General: He is not in acute distress.  HENT:      Head: Normocephalic and atraumatic.   Eyes:      General: No scleral icterus.  Neck:      Musculoskeletal: Neck supple.   Cardiovascular:      Rate and Rhythm: Normal rate and regular rhythm.   Pulmonary:      Effort: Pulmonary effort is normal.      Breath sounds: Normal breath sounds.   Abdominal:      General: There is no distension.      Palpations: Abdomen is soft. There is no mass.   Musculoskeletal: Normal range of motion.   Lymphadenopathy:      Comments:   2cm left cervical node, smaller  Resolved right cervical nodes   Skin:     General: Skin is warm.      Findings: No rash.   Neurological:      General: No focal deficit present. Mental Status: He is alert.   Psychiatric:         Mood and Affect: Mood normal.               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 Stage III mantle cell lymphoma  2. 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 Hypertension  4. Hyperlipidemia  5. ECOG PS 0    Plan:  Responding very nicely to treatment with significant decline in his clinically palpable disease.  No significant toxicities thus far.  He did have some mild constipation which I believe is related to the ondansetron.  Recommended utilizing milk of magnesia prophylactically days 1 through 5 of treatment when he is taking higher doses of ondansetron.  Assuming no new issues, continue BR with C2D1 = 03/15/2019.   He will receive 6 cycles of therapy followed by 2 years of rituximab maintenance therapy.  His adenopathy is obvious on physical exam and he is responding nicely.  No need for midcycle imaging unless clinical evidence of treatment failure.  Repeat PET/CT at completion of BR.  Due to his history of lymphoma, he has an elevated risk of infections and secondary malignancies.  I advised him to have an annual influenza vaccine, remain up-to-date with pneumococcal vaccinations, and receive the new shingles vaccine, Shingrix.  He additionally should have aggressive age-appropriate cancer screening.  We will investigate his vaccination history and initiate any indicated pneumococcal vaccinations or shingles vaccines after he has some immune reconstitution after BR.  RTC with [name] in 2 weeks, with me in 6 weeks, or sooner should new or concerning symptoms develop.    I have discussed the diagnosis and treatment plan with the patient and he expresses understanding and wishes to proceed.

## 2019-03-12 ENCOUNTER — Encounter: Admit: 2019-03-12 | Discharge: 2019-03-13 | Payer: MEDICARE

## 2019-03-12 DIAGNOSIS — Z1159 Encounter for screening for other viral diseases: Secondary | ICD-10-CM

## 2019-03-13 LAB — COVID-19 (SARS-COV-2) PCR

## 2019-03-13 NOTE — Progress Notes
Name: Robert Williams          MRN: [id number]      DOB: 1946-03-09      AGE: 73 y.o.   DATE OF SERVICE: 03/15/2019    Subjective:             Reason for Visit:  Cancer Follow up      Robert Williams is a 73 y.o. male.     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 Mikey Bussing with mantle cell lymphoma.  He is here prior to cycle 2 of bendamustine and Rituxan.  ?  He is doing well with no new complaints.  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  The swelling in his neck is down and he can almost see his clavicle.  He is active.  No HEENT, cardiovascular, pulmonary, dermatology, GU or GI complaints. Needs dental cleaning but is postponing til after treatment.  ?  1. ?Presented August 2020 with worsening left supraclavicular adenopathy. ?Initial chest x-ray imaging showed pathologically enlarged nodes.  2. ?01/13/2019 CT scans of the neck and chest showed bilateral cervical and supraclavicular adenopathy.  3. ?01/17/2019 FNA of supraclavicular node revealed CD5-positive monoclonal B-cell population consistent with mantle cell lymphoma. ?FISH for t(11;14) was positive, confirming the diagnosis.  4.  02/07/2019 PET/CT revealed hypermetabolic adenopathy above and below the diaphragm with no extranodal sites of disease.  Staging bone marrow biopsy was negative.  5.  02/15/2019 BR started  He is with his wife.     Review of Systems      Objective:         ? aspirin 325 mg tablet Take 1 Tab by mouth daily.   ? atorvastatin (LIPITOR) 40 mg tablet Take 1 Tab by mouth daily. (Patient taking differently: Take 40 mg by mouth at bedtime daily.)   ? losartan-hydrochlorothiazide (HYZAAR) 50-12.5 mg tablet Take 1 tablet by mouth daily. ? ondansetron (ZOFRAN) 8 mg tablet Take 1 tablet by mouth every 8 hours as needed for nasuea/vomitting.   ? oxyCODONE (ROXICODONE) 5 mg tablet Take one tablet by mouth every 4 hours as needed for Pain Indications: pain     Vitals:    03/15/19 0845   BP: 129/65   BP Source: Arm, Left Upper   Patient Position: Sitting   Pulse: 79   Resp: 14   Temp: 36.6 ?C (97.8 ?F)   TempSrc: Oral   SpO2: 99%   Weight: 100.8 kg (222 lb 3.2 oz)   Height: 182.9 cm (72)   PainSc: Zero     Body mass index is 30.14 kg/m?[name]     Pain Score: Zero         Pain Addressed:  N/A  [place] Cooperative Oncology Group performance status is 0, Fully active, able to carry on all pre-disease performance without restriction.[name]     Physical Exam  Vitals signs reviewed.   Constitutional:       Appearance: He is well-developed.   HENT:      Head: Normocephalic.   Neck:      Musculoskeletal: Normal range of motion.   Cardiovascular:      Rate and Rhythm: Normal rate  and regular rhythm.   Pulmonary:      Effort: Pulmonary effort is normal.      Breath sounds: Normal breath sounds.   Abdominal:      Palpations: Abdomen is soft.   Musculoskeletal: Normal range of motion.   Skin:     General: Skin is warm and dry.   Neurological:      Mental Status: He is alert and oriented to person, place, and time.     Left neck fullness with no discrete lymph nodes palpable.        CBC w/Diff    Lab Results   Component Value Date/Time    WBC 5.2 03/15/2019 08:37 AM    RBC 4.66 03/15/2019 08:37 AM    HGB 14.1 03/15/2019 08:37 AM    HCT 41.8 03/15/2019 08:37 AM    MCV 89.7 03/15/2019 08:37 AM    MCH 30.3 03/15/2019 08:37 AM    MCHC 33.8 03/15/2019 08:37 AM    RDW 13.8 03/15/2019 08:37 AM    PLTCT 248 03/15/2019 08:37 AM    MPV 7.7 03/15/2019 08:37 AM    Lab Results   Component Value Date/Time    NEUT 76 03/15/2019 08:37 AM    ANC 3.90 03/15/2019 08:37 AM    LYMA 10 (L) 03/15/2019 08:37 AM    ALC 0.50 (L) 03/15/2019 08:37 AM    MONA 11 03/15/2019 08:37 AM AMC 0.60 03/15/2019 08:37 AM    EOSA 1 03/15/2019 08:37 AM    AEC 0.10 03/15/2019 08:37 AM    BASA 2 03/15/2019 08:37 AM    ABC 0.10 03/15/2019 08:37 AM             Assessment and Plan:        1. Stage III mantle cell lymphoma.  He will proceed with cycle 2 of bendamustine Rituxan today.  He will follow with Dr. Mikey Bussing as scheduled prior to cycle 3.  We will plan to repeat PET at the end of treatment.  Overall he is tolerating treatment very well and according to the decrease in his lymph nodes he is responding.  2. 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 We discussed vaccinations.  He got his flu shot this fall.  He has not had pneumonia or Shingrix vaccines.  4. He will postpone dental work until after treatment completed.

## 2019-03-14 ENCOUNTER — Encounter: Admit: 2019-03-14 | Discharge: 2019-03-14 | Payer: MEDICARE

## 2019-03-15 ENCOUNTER — Encounter: Admit: 2019-03-15 | Discharge: 2019-03-15 | Payer: MEDICARE

## 2019-03-15 DIAGNOSIS — C8311 Mantle cell lymphoma, lymph nodes of head, face, and neck: Secondary | ICD-10-CM

## 2019-03-15 DIAGNOSIS — E785 Hyperlipidemia, unspecified: Secondary | ICD-10-CM

## 2019-03-15 DIAGNOSIS — I10 Essential (primary) hypertension: Secondary | ICD-10-CM

## 2019-03-15 DIAGNOSIS — C61 Malignant neoplasm of prostate: Secondary | ICD-10-CM

## 2019-03-15 MED ORDER — MONTELUKAST 10 MG PO TAB
10 mg | Freq: Once | ORAL | 0 refills | Status: CP
Start: 2019-03-15 — End: ?
  Administered 2019-03-15: 15:00:00 10 mg via ORAL

## 2019-03-15 MED ORDER — HEPARIN, PORCINE (PF) 100 UNIT/ML IV SYRG
500 [IU] | Freq: Once | 0 refills | Status: CP
Start: 2019-03-15 — End: ?

## 2019-03-15 MED ORDER — DIPHENHYDRAMINE HCL 25 MG PO CAP
25 mg | Freq: Once | ORAL | 0 refills | Status: CP
Start: 2019-03-15 — End: ?
  Administered 2019-03-15: 15:00:00 25 mg via ORAL

## 2019-03-15 MED ORDER — ONDANSETRON HCL 8 MG PO TAB
16 mg | Freq: Once | ORAL | 0 refills | Status: CP
Start: 2019-03-15 — End: ?
  Administered 2019-03-15: 15:00:00 16 mg via ORAL

## 2019-03-15 MED ORDER — ACETAMINOPHEN 325 MG PO TAB
650 mg | Freq: Once | ORAL | 0 refills | Status: CP
Start: 2019-03-15 — End: ?
  Administered 2019-03-15: 15:00:00 650 mg via ORAL

## 2019-03-15 MED ORDER — DEXAMETHASONE 6 MG PO TAB
12 mg | Freq: Once | ORAL | 0 refills | Status: CP
Start: 2019-03-15 — End: ?
  Administered 2019-03-15: 15:00:00 12 mg via ORAL

## 2019-03-15 MED ORDER — RITUXIMAB-PVVR IVPB
500 mg/m2 | Freq: Once | INTRAVENOUS | 0 refills | Status: CP
Start: 2019-03-15 — End: ?
  Administered 2019-03-15 (×3): 1100 mg via INTRAVENOUS

## 2019-03-15 MED ORDER — BENDAMUSTINE (BENDEKA) IVPB
90 mg/m2 | Freq: Once | INTRAVENOUS | 0 refills | Status: CP
Start: 2019-03-15 — End: ?
  Administered 2019-03-15 (×2): 200 mg via INTRAVENOUS

## 2019-03-15 NOTE — Patient Instructions
Call Immediately to report the following:  Uncontrolled nausea and/or vomiting, uncontrolled pain, or unusual bleeding.  Temperature of 100.4 F or greater and/or any sign/symptom of infection (redness, warmth, tenderness)  Painful mouth or difficulty swallowing  Red, cracked, or painful hands and/or feet  Diarrhea   Swelling of arms or legs  Rash    Important Phone Numbers:  [facility] Main Number (answered 24 hours a day) [phone]  Cancer Center Scheduling (appointments) [phone] OR [phone]  [facility] (for nutritional supplements) [phone]        Port Maintenance - If you have a port, it should be flushed every 6-8 weeks when not in use.  Please check with your MD, nurse, or the scheduler.

## 2019-03-15 NOTE — Progress Notes
Cycle 2 Day 1 Rituxan and Bendeka.   Port accessed, labs collected.  Patient to follow up with Morey Hummingbird, NP.  OK to treat, labs OK to treat.  Tolerated infusion well.   Port de-accessed per protocol, heparinized.   Discharged in stable condition.     CHEMO NOTE  Verified chemo consent signed and in chart.    Verified initiate chemo order in O2    Blood return positive via: Port (Single)    BSA and dose double checked (agree with orders as written) with: yes     Labs/applicable tests checked: CBC and Comprehensive Metabolic Panel (CMP)    Chemo regime: Drug/cycle/day Cycle 2 Day 1 Rituxan and Bendeka     Rate verified and armband double checkwith second RN: yes see MAR    Patient education offered and stated understanding. Denies questions at this time.

## 2019-03-16 ENCOUNTER — Encounter: Admit: 2019-03-16 | Discharge: 2019-03-16 | Payer: MEDICARE

## 2019-03-16 DIAGNOSIS — E785 Hyperlipidemia, unspecified: Secondary | ICD-10-CM

## 2019-03-16 DIAGNOSIS — I10 Essential (primary) hypertension: Secondary | ICD-10-CM

## 2019-03-16 DIAGNOSIS — C8311 Mantle cell lymphoma, lymph nodes of head, face, and neck: Secondary | ICD-10-CM

## 2019-03-16 DIAGNOSIS — C61 Malignant neoplasm of prostate: Secondary | ICD-10-CM

## 2019-03-16 MED ORDER — HEPARIN, PORCINE (PF) 100 UNIT/ML IV SYRG
500 [IU] | Freq: Once | 0 refills | Status: CP
Start: 2019-03-16 — End: ?

## 2019-03-16 MED ORDER — BENDAMUSTINE (BENDEKA) IVPB
90 mg/m2 | Freq: Once | INTRAVENOUS | 0 refills | Status: CP
Start: 2019-03-16 — End: ?
  Administered 2019-03-16 (×2): 200 mg via INTRAVENOUS

## 2019-03-16 MED ORDER — ONDANSETRON HCL 8 MG PO TAB
16 mg | Freq: Once | ORAL | 0 refills | Status: CP
Start: 2019-03-16 — End: ?
  Administered 2019-03-16: 17:00:00 16 mg via ORAL

## 2019-03-16 MED ORDER — DEXAMETHASONE 6 MG PO TAB
12 mg | Freq: Once | ORAL | 0 refills | Status: CP
Start: 2019-03-16 — End: ?
  Administered 2019-03-16: 17:00:00 12 mg via ORAL

## 2019-03-16 NOTE — Patient Instructions
Call Immediately to report the following:  Uncontrolled nausea and/or vomiting, uncontrolled pain, or unusual bleeding.  Temperature of 100.4 F or greater and/or any sign/symptom of infection (redness, warmth, tenderness)  Painful mouth or difficulty swallowing  Red, cracked, or painful hands and/or feet  Diarrhea   Swelling of arms or legs  Rash    Important Phone Numbers:  [facility] Main Number (answered 24 hours a day) [phone]  Cancer Center Scheduling (appointments) [phone] OR [phone]  [facility] (for nutritional supplements) [phone]        Port Maintenance - If you have a port, it should be flushed every 6-8 weeks when not in use.  Please check with your MD, nurse, or the scheduler.

## 2019-03-16 NOTE — Progress Notes
Cycle 2 Day 2 Bendeka.   Port accessed, blood returned.   Tolerated infusion well.   Port de-accessed per protocol, heparinized.   Discharged in stable condition.     CHEMO NOTE  Verified chemo consent signed and in chart.    Verified initiate chemo order in O2    Blood return positive via: Port (Single)    BSA and dose double checked (agree with orders as written) with: yes     Labs/applicable tests checked: None    Chemo regime: Drug/cycle/day Cycle 2 Day 2 Bendeka.     Rate verified and armband double checkwith second RN: yes see MAR    Patient education offered and stated understanding. Denies questions at this time.

## 2019-03-17 ENCOUNTER — Encounter: Admit: 2019-03-17 | Discharge: 2019-03-17 | Payer: MEDICARE

## 2019-04-09 ENCOUNTER — Encounter: Admit: 2019-04-09 | Discharge: 2019-04-10 | Payer: MEDICARE

## 2019-04-10 DIAGNOSIS — Z20828 Contact with and (suspected) exposure to other viral communicable diseases: Principal | ICD-10-CM

## 2019-04-11 ENCOUNTER — Encounter: Admit: 2019-04-11 | Discharge: 2019-04-11 | Payer: MEDICARE

## 2019-04-12 ENCOUNTER — Encounter: Admit: 2019-04-12 | Discharge: 2019-04-12 | Payer: MEDICARE

## 2019-04-12 DIAGNOSIS — C8311 Mantle cell lymphoma, lymph nodes of head, face, and neck: Secondary | ICD-10-CM

## 2019-04-12 DIAGNOSIS — E785 Hyperlipidemia, unspecified: Secondary | ICD-10-CM

## 2019-04-12 DIAGNOSIS — I10 Essential (primary) hypertension: Secondary | ICD-10-CM

## 2019-04-12 DIAGNOSIS — C61 Malignant neoplasm of prostate: Secondary | ICD-10-CM

## 2019-04-12 MED ORDER — BENDAMUSTINE (BENDEKA) IVPB
90 mg/m2 | Freq: Once | INTRAVENOUS | 0 refills | Status: CP
Start: 2019-04-12 — End: ?
  Administered 2019-04-12 (×2): 200 mg via INTRAVENOUS

## 2019-04-12 MED ORDER — MONTELUKAST 10 MG PO TAB
10 mg | Freq: Once | ORAL | 0 refills | Status: CP
Start: 2019-04-12 — End: ?
  Administered 2019-04-12: 15:00:00 10 mg via ORAL

## 2019-04-12 MED ORDER — RITUXIMAB-PVVR IVPB
500 mg/m2 | Freq: Once | INTRAVENOUS | 0 refills | Status: CP
Start: 2019-04-12 — End: ?
  Administered 2019-04-12 (×3): 1100 mg via INTRAVENOUS

## 2019-04-12 MED ORDER — SULFAMETHOXAZOLE-TRIMETHOPRIM 400-80 MG PO TAB
1 | ORAL_TABLET | Freq: Every day | ORAL | 1 refills | Status: DC
Start: 2019-04-12 — End: 2019-08-16

## 2019-04-12 MED ORDER — SULFAMETHOXAZOLE-TRIMETHOPRIM 400-80 MG PO TAB
1 | ORAL_TABLET | Freq: Two times a day (BID) | ORAL | 1 refills | Status: DC
Start: 2019-04-12 — End: 2019-04-12

## 2019-04-12 MED ORDER — ACETAMINOPHEN 325 MG PO TAB
650 mg | Freq: Once | ORAL | 0 refills | Status: CP
Start: 2019-04-12 — End: ?
  Administered 2019-04-12: 15:00:00 650 mg via ORAL

## 2019-04-12 MED ORDER — ACYCLOVIR 800 MG PO TAB
800 mg | ORAL_TABLET | Freq: Two times a day (BID) | ORAL | 1 refills | Status: DC
Start: 2019-04-12 — End: 2019-08-16

## 2019-04-12 MED ORDER — DEXAMETHASONE 6 MG PO TAB
12 mg | Freq: Once | ORAL | 0 refills | Status: CP
Start: 2019-04-12 — End: ?
  Administered 2019-04-12: 15:00:00 12 mg via ORAL

## 2019-04-12 MED ORDER — DIPHENHYDRAMINE HCL 25 MG PO CAP
25 mg | Freq: Once | ORAL | 0 refills | Status: CP
Start: 2019-04-12 — End: ?
  Administered 2019-04-12: 15:00:00 25 mg via ORAL

## 2019-04-12 MED ORDER — ONDANSETRON HCL 8 MG PO TAB
16 mg | Freq: Once | ORAL | 0 refills | Status: CP
Start: 2019-04-12 — End: ?
  Administered 2019-04-12: 15:00:00 16 mg via ORAL

## 2019-04-12 MED ORDER — HEPARIN, PORCINE (PF) 100 UNIT/ML IV SYRG
500 [IU] | Freq: Once | 0 refills | Status: CP
Start: 2019-04-12 — End: ?

## 2019-04-12 NOTE — Assessment & Plan Note
Impression:  1. Stage III mantle cell lymphoma  2. 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 Hypertension  4. Hyperlipidemia  5. ECOG PS 0    Plan:  Doing extremely well with treatment with complete resolution of all of his palpable adenopathy and no significant toxicities.  Constipation has resolved with prophylactic milk of magnesia and he will continue this.    Continue BR with C3D1 = 04/12/2019.   He will receive 6 cycles of therapy followed by 2 years of rituximab maintenance therapy.  His adenopathy is obvious on physical exam and he is responding nicely.  No need for midcycle imaging unless clinical evidence of treatment failure.  Repeat PET/CT at completion of BR.  Start acyclovir 800 mg p.o. twice daily  Start Bactrim SS daily.  Due to his history of lymphoma, he has an elevated risk of infections and secondary malignancies.  He additionally should have aggressive age-appropriate cancer screening.  We will investigate his vaccination history and initiate any indicated pneumococcal vaccinations or shingles vaccines after he has some immune reconstitution after BR.  We discussed that he should get a COVID-19 vaccine when one becomes available.  There is no current published data on the vaccine nor do we have clear instruction on how will be distributed so it is difficult to answer questions that he has today regarding logistics of this.  RTC with Morey Hummingbird in 4 weeks, with me in 8 weeks, or sooner should new or concerning symptoms develop.    I have discussed the diagnosis and treatment plan with the patient and he expresses understanding and wishes to proceed.

## 2019-04-12 NOTE — Progress Notes
Cycle 3 Day 1 Ruxience and Bendeka.  Port accessed, labs collected.  Patient to follow up with Dr. Heber Carolina.  OK to treat, labs OK to treat.  Tolerated infusion well.   Port de-accessed per protocol, heparinized.   Discharged in stable condition.     CHEMO NOTE  Verified chemo consent signed and in chart.    Verified initiate chemo order in O2    Blood return positive via: Port (Single)    BSA and dose double checked (agree with orders as written) with: yes     Labs/applicable tests checked: CBC and Comprehensive Metabolic Panel (CMP)    Chemo regime: Drug/cycle/day Cycle 3 Day 1 Ruxience and Bendeka     Rate verified and armband double checkwith second RN: yes see MAR    Patient education offered and stated understanding. Denies questions at this time.

## 2019-04-12 NOTE — Progress Notes
Name: Robert Williams          MRN: [id number]      DOB: May 04, 1946      AGE: 73 y.o.   DATE OF SERVICE: 04/12/2019    Subjective:             Reason for Visit:  Follow Up      Robert Williams is a 73 y.o. male.       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 Susann Givens.    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  Presented August 2020 with worsening left supraclavicular adenopathy.  Initial chest x-ray imaging showed pathologically enlarged nodes.  2.  01/13/2019 CT scans of the neck and chest showed bilateral cervical and supraclavicular adenopathy.  3.  01/17/2019 FNA of supraclavicular node revealed CD5-positive monoclonal B-cell population consistent with mantle cell lymphoma.  FISH for t(11;14) was positive, confirming the diagnosis.  4.  02/07/2019 PET/CT revealed hypermetabolic adenopathy above and below the diaphragm with no extranodal sites of disease.  Staging bone marrow biopsy was negative.  5.  02/15/2019 BR started    Interim History:  No issues at all.  Denies nausea or vomiting.  Has been taking MOM for his constipation proactively and this has been working well.  Nodes all resolved.  No fevers or infections.  Continues to work full-time and has excellent energy.  No fevers, drenching night sweats, unintentional weight loss.  No interim hospitalizations or transfusions.    I have reviewed and updated the past medical, social and family histories in the history section and they are up to date as of this visit.    I have extensively reviewed the laboratory, pathology and radiology, both internal and external, and the key findings are summarized above. Review of Systems   All other systems reviewed and are negative.        Objective:         ? aspirin 325 mg tablet Take 1 Tab by mouth daily.   ? atorvastatin (LIPITOR) 40 mg tablet Take 1 Tab by mouth daily. (Patient taking differently: Take 40 mg by mouth at bedtime daily.)   ? losartan-hydrochlorothiazide (HYZAAR) 50-12.5 mg tablet Take 1 tablet by mouth daily.   ? ondansetron (ZOFRAN) 8 mg tablet Take 1 tablet by mouth every 8 hours as needed for nasuea/vomitting.   ? oxyCODONE (ROXICODONE) 5 mg tablet Take one tablet by mouth every 4 hours as needed for Pain Indications: pain     Vitals:    04/12/19 0846   BP: 125/70   BP Source: Arm, Right Upper   Patient Position: Sitting   Pulse: 76   Resp: 18   Temp: 36.9 ?C (98.4 ?F)   TempSrc: Oral   SpO2: 97%   Weight: 98.5 kg (217 lb 3.2 oz)   Height: 182.9 cm (72)   PainSc: Zero     Body mass index is 29.46 kg/m?[name]     Pain Score: Zero       Fatigue Scale: 0-None    Pain Addressed:  N/A    Patient Evaluated for a Clinical Trial: Patient not eligible  for a treatment trial (including not needing treatment, needs palliative care, in remission).     [place] Cooperative Oncology Group performance status is 0, Fully active, able to carry on all pre-disease performance without restriction.[name]     Physical Exam  Vitals signs and nursing note reviewed.   Constitutional:       General: He is not in acute distress.  HENT:      Head: Normocephalic and atraumatic.   Eyes:      General: No scleral icterus.  Neck:      Musculoskeletal: Neck supple.   Cardiovascular:      Rate and Rhythm: Normal rate and regular rhythm.   Pulmonary:      Effort: Pulmonary effort is normal.      Breath sounds: Normal breath sounds.   Abdominal:      General: There is no distension.      Palpations: Abdomen is soft. There is no mass.   Musculoskeletal: Normal range of motion.   Lymphadenopathy:      Comments:   Cervical adenopathy resolved.  No other palpable adenopathy.   Skin: General: Skin is warm.      Findings: No rash.   Neurological:      General: No focal deficit present.      Mental Status: He is alert.   Psychiatric:         Mood and Affect: Mood normal.               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 Stage III mantle cell lymphoma  2. 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 Hypertension  4. Hyperlipidemia  5. ECOG PS 0    Plan:  Doing extremely well with treatment with complete resolution of all of his palpable adenopathy and no significant toxicities.  Constipation has resolved with prophylactic milk of magnesia and he will continue this.    Continue BR with C3D1 = 04/12/2019.   He will receive 6 cycles of therapy followed by 2 years of rituximab maintenance therapy.  His adenopathy is obvious on physical exam and he is responding nicely.  No need for midcycle imaging unless clinical evidence of treatment failure.  Repeat PET/CT at completion of BR.  Start acyclovir 800 mg p.o. twice daily  Start Bactrim SS daily.  Due to his history of lymphoma, he has an elevated risk of infections and secondary malignancies.  He additionally should have aggressive age-appropriate cancer screening.  We will investigate his vaccination history and initiate any indicated pneumococcal vaccinations or shingles vaccines after he has some immune reconstitution after BR.  We discussed that he should get a COVID-19 vaccine when one becomes available.  There is no current published data on the vaccine nor do we have clear instruction on how will be distributed so it is difficult to answer questions that he has today regarding logistics of this.  RTC with [name] in 4 weeks, with me in 8 weeks, or sooner should new or concerning symptoms develop.    I have discussed the diagnosis and treatment plan with the patient and he expresses understanding and wishes to proceed.

## 2019-04-12 NOTE — Patient Instructions
Call Immediately to report the following:  Uncontrolled nausea and/or vomiting, uncontrolled pain, or unusual bleeding.  Temperature of 100.4 F or greater and/or any sign/symptom of infection (redness, warmth, tenderness)  Painful mouth or difficulty swallowing  Red, cracked, or painful hands and/or feet  Diarrhea   Swelling of arms or legs  Rash    Important Phone Numbers:  [facility] Main Number (answered 24 hours a day) [phone]  Cancer Center Scheduling (appointments) [phone] OR [phone]  [facility] (for nutritional supplements) [phone]        Port Maintenance - If you have a port, it should be flushed every 6-8 weeks when not in use.  Please check with your MD, nurse, or the scheduler.

## 2019-04-13 ENCOUNTER — Encounter: Admit: 2019-04-13 | Discharge: 2019-04-13 | Payer: MEDICARE

## 2019-04-13 DIAGNOSIS — C8311 Mantle cell lymphoma, lymph nodes of head, face, and neck: Secondary | ICD-10-CM

## 2019-04-13 DIAGNOSIS — E785 Hyperlipidemia, unspecified: Secondary | ICD-10-CM

## 2019-04-13 DIAGNOSIS — C61 Malignant neoplasm of prostate: Secondary | ICD-10-CM

## 2019-04-13 DIAGNOSIS — I10 Essential (primary) hypertension: Secondary | ICD-10-CM

## 2019-04-13 MED ORDER — DEXAMETHASONE 6 MG PO TAB
12 mg | Freq: Once | ORAL | 0 refills | Status: CP
Start: 2019-04-13 — End: ?
  Administered 2019-04-13: 18:00:00 12 mg via ORAL

## 2019-04-13 MED ORDER — ONDANSETRON HCL 8 MG PO TAB
16 mg | Freq: Once | ORAL | 0 refills | Status: CP
Start: 2019-04-13 — End: ?
  Administered 2019-04-13: 18:00:00 16 mg via ORAL

## 2019-04-13 MED ORDER — BENDAMUSTINE (BENDEKA) IVPB
90 mg/m2 | Freq: Once | INTRAVENOUS | 0 refills | Status: CP
Start: 2019-04-13 — End: ?
  Administered 2019-04-13 (×2): 200 mg via INTRAVENOUS

## 2019-04-13 MED ORDER — HEPARIN, PORCINE (PF) 100 UNIT/ML IV SYRG
500 [IU] | Freq: Once | 0 refills | Status: CP
Start: 2019-04-13 — End: ?

## 2019-04-13 NOTE — Progress Notes
CHEMO NOTE  Verified chemo consent signed and in chart.    Verified initiate chemo order in O2    Blood return positive via: Port (Single)    BSA and dose double checked (agree with orders as written) with: yes  see MAR    Labs/applicable tests checked: CBC and Comprehensive Metabolic Panel (CMP)    Chemo regime: Drug/cycle/day  cycle 3, day 2 Bendeka    Rate verified and armband double checkwith second RN: yes    Patient education offered and stated understanding. Denies questions at this time.    Pt here for day2; offers no complaints; feels tolerating treatment well  AVS given  Dismissed amb in satisfactory condition

## 2019-05-05 DIAGNOSIS — C888 Other malignant immunoproliferative diseases: Secondary | ICD-10-CM

## 2019-05-05 DIAGNOSIS — C8311 Mantle cell lymphoma, lymph nodes of head, face, and neck: Secondary | ICD-10-CM

## 2019-05-07 ENCOUNTER — Encounter: Admit: 2019-05-07 | Discharge: 2019-05-08 | Payer: MEDICARE

## 2019-05-08 DIAGNOSIS — Z1159 Encounter for screening for other viral diseases: Principal | ICD-10-CM

## 2019-05-10 ENCOUNTER — Encounter: Admit: 2019-05-10 | Discharge: 2019-05-10 | Payer: MEDICARE

## 2019-05-10 DIAGNOSIS — I10 Essential (primary) hypertension: Secondary | ICD-10-CM

## 2019-05-10 DIAGNOSIS — C8311 Mantle cell lymphoma, lymph nodes of head, face, and neck: Secondary | ICD-10-CM

## 2019-05-10 DIAGNOSIS — C61 Malignant neoplasm of prostate: Secondary | ICD-10-CM

## 2019-05-10 DIAGNOSIS — E785 Hyperlipidemia, unspecified: Secondary | ICD-10-CM

## 2019-05-10 MED ORDER — ACETAMINOPHEN 325 MG PO TAB
650 mg | Freq: Once | ORAL | 0 refills | Status: CP
Start: 2019-05-10 — End: ?
  Administered 2019-05-10: 16:00:00 650 mg via ORAL

## 2019-05-10 MED ORDER — DIPHENHYDRAMINE HCL 25 MG PO CAP
25 mg | Freq: Once | ORAL | 0 refills | Status: CP
Start: 2019-05-10 — End: ?
  Administered 2019-05-10: 16:00:00 25 mg via ORAL

## 2019-05-10 MED ORDER — BENDAMUSTINE (BENDEKA) IVPB
90 mg/m2 | Freq: Once | INTRAVENOUS | 0 refills | Status: CP
Start: 2019-05-10 — End: ?
  Administered 2019-05-10 (×2): 200 mg via INTRAVENOUS

## 2019-05-10 MED ORDER — ONDANSETRON HCL 8 MG PO TAB
16 mg | Freq: Once | ORAL | 0 refills | Status: CP
Start: 2019-05-10 — End: ?
  Administered 2019-05-10: 16:00:00 16 mg via ORAL

## 2019-05-10 MED ORDER — RITUXIMAB-PVVR IVPB
500 mg/m2 | Freq: Once | INTRAVENOUS | 0 refills | Status: CP
Start: 2019-05-10 — End: ?
  Administered 2019-05-10 (×3): 1100 mg via INTRAVENOUS

## 2019-05-10 MED ORDER — MONTELUKAST 10 MG PO TAB
10 mg | Freq: Once | ORAL | 0 refills | Status: CP
Start: 2019-05-10 — End: ?
  Administered 2019-05-10: 16:00:00 10 mg via ORAL

## 2019-05-10 MED ORDER — DEXAMETHASONE 6 MG PO TAB
12 mg | Freq: Once | ORAL | 0 refills | Status: CP
Start: 2019-05-10 — End: ?
  Administered 2019-05-10: 16:00:00 12 mg via ORAL

## 2019-05-10 MED ORDER — HEPARIN, PORCINE (PF) 100 UNIT/ML IV SYRG
500 [IU] | Freq: Once | 0 refills | Status: CP
Start: 2019-05-10 — End: ?

## 2019-05-10 NOTE — Progress Notes
Day 1 Cycle 4 Rituximab + [employer] accessed, labs collected.  Patient to follow up with Morey Hummingbird, NP.   OK to treat, labs OK to treat.  Tolerated infusions well.   Port de-accessed per protocol, heparinized.   Discharged in stable condition.     CHEMO NOTE  Verified chemo consent signed and in chart.    Verified initiate chemo order in O2    Blood return positive via: Port (Single and Accessed)    BSA and dose double checked (agree with orders as written) with: yes      Labs/applicable tests checked: CBC and Comprehensive Metabolic Panel (CMP)    Chemo regime: Drug/cycle/day Rituximab + Bendeka C4D1    Rate verified and armband double checkwith second RN: yes, see EMAR     Patient education offered and stated understanding. Denies questions at this time.

## 2019-05-10 NOTE — Progress Notes
Name: Robert Williams          MRN: [id number]      DOB: Mar 21, 1946      AGE: 73 y.o.   DATE OF SERVICE: 05/10/2019    Subjective:             Reason for Visit:  Follow Up      Robert Williams is a 73 y.o. male.     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 Mikey Bussing with mantle cell lymphoma.  Robert Williams is here prior to cycle 4 of bendamustine and Rituxan.  ?  Robert Williams is doing well with no new complaints. 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  His adenopathy in his neck is resolved. ?Robert Williams is active. ?No HEENT, cardiovascular, pulmonary, dermatology, GU or GI complaints. Needs dental cleaning but is postponing til after treatment.  ?  1. ?Presented August 2020 with worsening left supraclavicular adenopathy. ?Initial chest x-ray imaging showed pathologically enlarged nodes.  2. ?01/13/2019 CT scans of the neck and chest showed bilateral cervical and supraclavicular adenopathy.  3. ?01/17/2019 FNA of supraclavicular node revealed CD5-positive monoclonal B-cell population consistent with mantle cell lymphoma. ?FISH for t(11;14) was positive, confirming the diagnosis.  4. ?02/07/2019 PET/CT revealed hypermetabolic adenopathy above and below the diaphragm with no extranodal sites of disease. ?Staging bone marrow biopsy was negative.  5. ?02/15/2019 BR started  Robert Williams is alone.       Review of Systems      Objective:         ? acyclovir (ZOVIRAX) 800 mg tablet Take one tablet by mouth every 12 hours.   ? aspirin 325 mg tablet Take 1 Tab by mouth daily.   ? atorvastatin (LIPITOR) 40 mg tablet Take 1 Tab by mouth daily. (Patient taking differently: Take 40 mg by mouth at bedtime daily.) ? losartan-hydrochlorothiazide (HYZAAR) 50-12.5 mg tablet Take 1 tablet by mouth daily.   ? ondansetron (ZOFRAN) 8 mg tablet Take 1 tablet by mouth every 8 hours as needed for nasuea/vomitting.   ? trimethoprim/sulfamethoxazole (BACTRIM) 80/400 mg tablet Take one tablet by mouth daily.     Vitals:    05/10/19 0948   BP: 129/75   BP Source: Arm, Right Upper   Patient Position: Sitting   Pulse: 75   Resp: 18   Temp: 36.9 ?C (98.4 ?F)   TempSrc: Oral   SpO2: 99%   Weight: 101.7 kg (224 lb 3.2 oz)   Height: 182.9 cm (72)   PainSc: Zero     Body mass index is 30.41 kg/m?[name]     Pain Score: Zero         Pain Addressed:  N/A  [place] Cooperative Oncology Group performance status is 0, Fully active, able to carry on all pre-disease performance without restriction.[name]     Physical Exam  Vitals signs reviewed.   Constitutional:       Appearance: Robert Williams is well-developed.   HENT:      Head: Normocephalic.   Neck:      Musculoskeletal: Normal range of motion.   Cardiovascular:      Rate and Rhythm: Normal rate and regular rhythm.   Pulmonary:  Effort: Pulmonary effort is normal.      Breath sounds: Normal breath sounds.   Abdominal:      Palpations: Abdomen is soft.   Musculoskeletal: Normal range of motion.   Skin:     General: Skin is warm and dry.   Neurological:      Mental Status: Robert Williams is alert and oriented to person, place, and time.     No adenopathy palpable.       CBC w/Diff    Lab Results   Component Value Date/Time    WBC 5.3 05/10/2019 09:12 AM    RBC 4.68 05/10/2019 09:12 AM    HGB 14.2 05/10/2019 09:12 AM    HCT 42.5 05/10/2019 09:12 AM    MCV 90.9 05/10/2019 09:12 AM    MCH 30.4 05/10/2019 09:12 AM    MCHC 33.5 05/10/2019 09:12 AM    RDW 15.0 05/10/2019 09:12 AM    PLTCT 254 05/10/2019 09:12 AM    MPV 7.5 05/10/2019 09:12 AM    Lab Results   Component Value Date/Time    NEUT 72 05/10/2019 09:12 AM    ANC 3.90 05/10/2019 09:12 AM    LYMA 10 (L) 05/10/2019 09:12 AM    ALC 0.60 (L) 05/10/2019 09:12 AM MONA 13 (H) 05/10/2019 09:12 AM    AMC 0.70 05/10/2019 09:12 AM    EOSA 3 05/10/2019 09:12 AM    AEC 0.20 05/10/2019 09:12 AM    BASA 2 05/10/2019 09:12 AM    ABC 0.10 05/10/2019 09:12 AM             Assessment and Plan:       1. Stage III mantle cell lymphoma.  Robert Williams will proceed with cycle 4 of bendamustine Rituxan today.  Robert Williams will follow with Dr. Mikey Bussing as scheduled prior to cycle 5.  We will plan to repeat PET at the end of treatment.  Overall Robert Williams is tolerating treatment very well and according to the resolution of adenopathy Robert Williams is responding very well.  2. 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 Robert Williams asked several good questions about the COVID-19 vaccine.  4. Robert Williams will postpone dental work until after treatment completed.  5. Robert Williams will continue acyclovir 800 mg twice daily.  6. Robert Williams will continue Bactrim single strength daily.   ?

## 2019-05-10 NOTE — Patient Instructions
Call Immediately to report the following:  Uncontrolled nausea and/or vomiting, uncontrolled pain, or unusual bleeding.  Temperature of 100.4 F or greater and/or any sign/symptom of infection (redness, warmth, tenderness)  Painful mouth or difficulty swallowing  Red, cracked, or painful hands and/or feet  Diarrhea   Swelling of arms or legs  Rash    Important Phone Numbers:  [facility] Main Number (answered 24 hours a day) [phone]  Cancer Center Scheduling (appointments) [phone] OR [phone]  [facility] (for nutritional supplements) [phone]        Port Maintenance - If you have a port, it should be flushed every 6-8 weeks when not in use.  Please check with your MD, nurse, or the scheduler.

## 2019-05-11 ENCOUNTER — Encounter: Admit: 2019-05-11 | Discharge: 2019-05-11 | Payer: MEDICARE

## 2019-05-11 DIAGNOSIS — C61 Malignant neoplasm of prostate: Secondary | ICD-10-CM

## 2019-05-11 DIAGNOSIS — C8311 Mantle cell lymphoma, lymph nodes of head, face, and neck: Secondary | ICD-10-CM

## 2019-05-11 DIAGNOSIS — I10 Essential (primary) hypertension: Secondary | ICD-10-CM

## 2019-05-11 DIAGNOSIS — E785 Hyperlipidemia, unspecified: Secondary | ICD-10-CM

## 2019-05-11 MED ORDER — ONDANSETRON HCL 8 MG PO TAB
16 mg | Freq: Once | ORAL | 0 refills | Status: CP
Start: 2019-05-11 — End: ?
  Administered 2019-05-11: 18:00:00 16 mg via ORAL

## 2019-05-11 MED ORDER — DEXAMETHASONE 6 MG PO TAB
12 mg | Freq: Once | ORAL | 0 refills | Status: CP
Start: 2019-05-11 — End: ?
  Administered 2019-05-11: 18:00:00 12 mg via ORAL

## 2019-05-11 MED ORDER — BENDAMUSTINE (BENDEKA) IVPB
90 mg/m2 | Freq: Once | INTRAVENOUS | 0 refills | Status: CP
Start: 2019-05-11 — End: ?
  Administered 2019-05-11: 18:00:00 200 mg via INTRAVENOUS

## 2019-05-11 MED ORDER — HEPARIN, PORCINE (PF) 100 UNIT/ML IV SYRG
500 [IU] | Freq: Once | 0 refills | Status: CP
Start: 2019-05-11 — End: ?

## 2019-05-11 NOTE — Patient Instructions
Call Immediately to report the following:  Uncontrolled nausea and/or vomiting, uncontrolled pain, or unusual bleeding.  Temperature of 100.4 F or greater and/or any sign/symptom of infection (redness, warmth, tenderness)  Painful mouth or difficulty swallowing  Red, cracked, or painful hands and/or feet  Diarrhea   Swelling of arms or legs  Rash    Important Phone Numbers:  [facility] Main Number (answered 24 hours a day) [phone]  Cancer Center Scheduling (appointments) [phone] OR [phone]  [facility] (for nutritional supplements) [phone]        Port Maintenance - If you have a port, it should be flushed every 6-8 weeks when not in use.  Please check with your MD, nurse, or the scheduler.

## 2019-05-11 NOTE — Progress Notes
Day 2 Cycle 4 [address] accessed, labs collected.  OK to treat, labs OK to treat.  Tolerated infusion well.   Port de-accessed per protocol, heparinized.   Discharged in stable condition.     CHEMO NOTE  Verified chemo consent signed and in chart.    Verified initiate chemo order in O2    Blood return positive via: Port (Single and Accessed)    BSA and dose double checked (agree with orders as written) with: yes     Labs/applicable tests checked: Comprehensive Metabolic Panel (CMP)    Chemo regime: Drug/cycle/day Verl Dicker C4D2     Rate verified and armband double checkwith second RN: yes, see EMAR      Patient education offered and stated understanding. Denies questions at this time.

## 2019-06-04 ENCOUNTER — Encounter: Admit: 2019-06-04 | Discharge: 2019-06-05 | Payer: MEDICARE

## 2019-06-04 DIAGNOSIS — Z1159 Encounter for screening for other viral diseases: Secondary | ICD-10-CM

## 2019-06-05 LAB — COVID-19 (SARS-COV-2) PCR

## 2019-06-06 ENCOUNTER — Encounter: Admit: 2019-06-06 | Discharge: 2019-06-06 | Payer: MEDICARE

## 2019-06-07 ENCOUNTER — Encounter: Admit: 2019-06-07 | Discharge: 2019-06-07 | Payer: MEDICARE

## 2019-06-07 DIAGNOSIS — C8311 Mantle cell lymphoma, lymph nodes of head, face, and neck: Secondary | ICD-10-CM

## 2019-06-07 DIAGNOSIS — E785 Hyperlipidemia, unspecified: Secondary | ICD-10-CM

## 2019-06-07 DIAGNOSIS — I10 Essential (primary) hypertension: Secondary | ICD-10-CM

## 2019-06-07 DIAGNOSIS — C61 Malignant neoplasm of prostate: Secondary | ICD-10-CM

## 2019-06-07 MED ORDER — ONDANSETRON HCL 8 MG PO TAB
16 mg | Freq: Once | ORAL | 0 refills | Status: CP
Start: 2019-06-07 — End: ?
  Administered 2019-06-07: 16:00:00 16 mg via ORAL

## 2019-06-07 MED ORDER — DEXAMETHASONE 6 MG PO TAB
12 mg | Freq: Once | ORAL | 0 refills | Status: CP
Start: 2019-06-07 — End: ?
  Administered 2019-06-07: 16:00:00 12 mg via ORAL

## 2019-06-07 MED ORDER — RITUXIMAB-PVVR IVPB
500 mg/m2 | Freq: Once | INTRAVENOUS | 0 refills | Status: CP
Start: 2019-06-07 — End: ?
  Administered 2019-06-07 (×3): 1100 mg via INTRAVENOUS

## 2019-06-07 MED ORDER — BENDAMUSTINE (BENDEKA) IVPB
90 mg/m2 | Freq: Once | INTRAVENOUS | 0 refills | Status: CP
Start: 2019-06-07 — End: ?
  Administered 2019-06-07 (×2): 200 mg via INTRAVENOUS

## 2019-06-07 MED ORDER — HEPARIN, PORCINE (PF) 100 UNIT/ML IV SYRG
500 [IU] | Freq: Once | 0 refills | Status: CP
Start: 2019-06-07 — End: ?

## 2019-06-07 MED ORDER — DIPHENHYDRAMINE HCL 25 MG PO CAP
25 mg | Freq: Once | ORAL | 0 refills | Status: CP
Start: 2019-06-07 — End: ?
  Administered 2019-06-07: 16:00:00 25 mg via ORAL

## 2019-06-07 MED ORDER — ACETAMINOPHEN 325 MG PO TAB
650 mg | Freq: Once | ORAL | 0 refills | Status: CP
Start: 2019-06-07 — End: ?
  Administered 2019-06-07: 16:00:00 650 mg via ORAL

## 2019-06-07 MED ORDER — MONTELUKAST 10 MG PO TAB
10 mg | Freq: Once | ORAL | 0 refills | Status: CP
Start: 2019-06-07 — End: ?
  Administered 2019-06-07: 16:00:00 10 mg via ORAL

## 2019-06-07 NOTE — Progress Notes
Day 1 Cycle 5 [facility] [employer] accessed, labs collected.  Patient to follow up with Dr. Bryna Colander.   OK to treat, labs OK to treat.  Tolerated infusions well.   Port de-accessed per protocol, heparinized.   Discharged in stable condition.     CHEMO NOTE  Verified chemo consent signed and in chart.    Verified initiate chemo order in O2    Blood return positive via: Port (Single and Accessed)    BSA and dose double checked (agree with orders as written) with: yes     Labs/applicable tests checked: CBC and Comprehensive Metabolic Panel (CMP)    Chemo regime: Drug/cycle/day [facility] + Bendeka C5D1    Rate verified and armband double checkwith second RN: yes, see EMAR.     Patient education offered and stated understanding. Denies questions at this time.

## 2019-06-07 NOTE — Progress Notes
Name: Robert Williams          MRN: [id number]      DOB: April 21, 1946      AGE: 74 y.o.   DATE OF SERVICE: 06/07/2019    Subjective:             Reason for Visit:  Follow Up      Robert Williams is a 74 y.o. male.     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 Susann Givens.    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  Presented August 2020 with worsening left supraclavicular adenopathy.  Initial chest x-ray imaging showed pathologically enlarged nodes.  2.  01/13/2019 CT scans of the neck and chest showed bilateral cervical and supraclavicular adenopathy.  3.  01/17/2019 FNA of supraclavicular node revealed CD5-positive monoclonal B-cell population consistent with mantle cell lymphoma.  FISH for t(11;14) was positive, confirming the diagnosis.  4.  02/07/2019 PET/CT revealed hypermetabolic adenopathy above and below the diaphragm with no extranodal sites of disease.  Staging bone marrow biopsy was negative.  5.  02/15/2019 BR started    Interim History:  Feel good.  No new or concerning issues at all.  Denies nausea or vomiting.  Has been taking MOM for his constipation proactively and this has been working well.  Nodes all remain resolved.  No fevers or infections.  Continues to work full-time and has excellent energy.  No fevers, drenching night sweats, unintentional weight loss.  No interim hospitalizations or transfusions.    I have reviewed and updated the past medical, social and family histories in the history section and they are up to date as of this visit. I have extensively reviewed the laboratory, pathology and radiology, both internal and external, and the key findings are summarized above.         Review of Systems   All other systems reviewed and are negative.        Objective:         ? acyclovir (ZOVIRAX) 800 mg tablet Take one tablet by mouth every 12 hours.   ? aspirin 325 mg tablet Take 1 Tab by mouth daily.   ? atorvastatin (LIPITOR) 40 mg tablet Take 1 Tab by mouth daily. (Patient taking differently: Take 40 mg by mouth at bedtime daily.)   ? losartan-hydrochlorothiazide (HYZAAR) 50-12.5 mg tablet Take 1 tablet by mouth daily.   ? ondansetron (ZOFRAN) 8 mg tablet Take 1 tablet by mouth every 8 hours as needed for nasuea/vomitting.   ? trimethoprim/sulfamethoxazole (BACTRIM) 80/400 mg tablet Take one tablet by mouth daily.     Vitals:    06/07/19 0839   BP: 118/69   BP Source: Arm, Left Upper   Patient Position: Sitting   Pulse: 79   Resp: 16   Temp: 36.7 ?C (98.1 ?F)   TempSrc: Oral   SpO2: 98%   Weight: 101.3 kg (223 lb 6.4 oz)   Height: 182.9 cm (72.01)   PainSc: Zero     Body mass index is 30.29 kg/m?[name]     Pain Score: Zero       Fatigue Scale:  0-None    Pain Addressed:  N/A    Patient Evaluated for a Clinical Trial: Patient not eligible for a treatment trial (including not needing treatment, needs palliative care, in remission).     [place] Cooperative Oncology Group performance status is 0, Fully active, able to carry on all pre-disease performance without restriction.     Physical Exam  Vitals signs and nursing note reviewed.   Constitutional:       General: He is not in acute distress.  HENT:      Head: Normocephalic and atraumatic.   Eyes:      General: No scleral icterus.  Neck:      Musculoskeletal: Neck supple.   Cardiovascular:      Rate and Rhythm: Normal rate and regular rhythm.   Pulmonary:      Effort: Pulmonary effort is normal.      Breath sounds: Normal breath sounds.   Abdominal:      General: There is no distension. Palpations: Abdomen is soft. There is no mass.   Musculoskeletal: Normal range of motion.   Lymphadenopathy:      Comments:   Cervical adenopathy resolved.  No other palpable adenopathy.   Skin:     General: Skin is warm.      Findings: No rash.   Neurological:      General: No focal deficit present.      Mental Status: He is alert.   Psychiatric:         Mood and Affect: Mood normal.               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 Stage III mantle cell lymphoma  2. 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 Hypertension  4. Hyperlipidemia  5. ECOG PS 0    Plan:  Doing extremely well with treatment with complete resolution of all of his palpable adenopathy and no significant toxicities.  Constipation has resolved with prophylactic milk of magnesia and he will continue this.    Continue BR with C5D1 = 06/07/2019.   He will receive 6 cycles of therapy followed by 2 years of rituximab maintenance therapy.  Discussed with him today that, if he achieves a CR, I would not plan on initiating rituximab maintenance until at least 1 month after completing the coronavirus vaccine.  His vaccine response would be blunted by additional rituximab and risk of severe COVID-19 infection is demonstrably increased with rituximab exposure.  His adenopathy is obvious on physical exam and he is responding nicely.  No need for midcycle imaging unless clinical evidence of treatment failure.  Repeat PET/CT at completion of BR.  Continue acyclovir 800 mg p.o. twice daily  Continue Bactrim SS daily.  Due to his history of lymphoma, he has an elevated risk of infections and secondary malignancies.  He additionally should have aggressive age-appropriate cancer screening. We will investigate his vaccination history and initiate any indicated pneumococcal vaccinations or shingles vaccines after he has some immune reconstitution after BR.  RTC with Carrie in 4 weeks, with me in 10 weeks with restaging, or sooner should new or concerning symptoms develop.    I have discussed the diagnosis and treatment plan with the patient and he expresses understanding and wishes to proceed.

## 2019-06-07 NOTE — Patient Instructions
Call Immediately to report the following:  Uncontrolled nausea and/or vomiting, uncontrolled pain, or unusual bleeding.  Temperature of 100.4 F or greater and/or any sign/symptom of infection (redness, warmth, tenderness)  Painful mouth or difficulty swallowing  Red, cracked, or painful hands and/or feet  Diarrhea   Swelling of arms or legs  Rash    Important Phone Numbers:  [facility] Main Number (answered 24 hours a day) [phone]  Cancer Center Scheduling (appointments) [phone] OR [phone]  [facility] (for nutritional supplements) [phone]        Port Maintenance - If you have a port, it should be flushed every 6-8 weeks when not in use.  Please check with your MD, nurse, or the scheduler.

## 2019-06-08 ENCOUNTER — Encounter: Admit: 2019-06-08 | Discharge: 2019-06-08 | Payer: MEDICARE

## 2019-06-08 DIAGNOSIS — C61 Malignant neoplasm of prostate: Secondary | ICD-10-CM

## 2019-06-08 DIAGNOSIS — E785 Hyperlipidemia, unspecified: Secondary | ICD-10-CM

## 2019-06-08 DIAGNOSIS — C8311 Mantle cell lymphoma, lymph nodes of head, face, and neck: Secondary | ICD-10-CM

## 2019-06-08 DIAGNOSIS — I10 Essential (primary) hypertension: Secondary | ICD-10-CM

## 2019-06-08 MED ORDER — BENDAMUSTINE (BENDEKA) IVPB
90 mg/m2 | Freq: Once | INTRAVENOUS | 0 refills | Status: CP
Start: 2019-06-08 — End: ?
  Administered 2019-06-08: 19:00:00 200 mg via INTRAVENOUS

## 2019-06-08 MED ORDER — DEXAMETHASONE 6 MG PO TAB
12 mg | Freq: Once | ORAL | 0 refills | Status: CP
Start: 2019-06-08 — End: ?
  Administered 2019-06-08: 18:00:00 12 mg via ORAL

## 2019-06-08 MED ORDER — ONDANSETRON HCL 8 MG PO TAB
16 mg | Freq: Once | ORAL | 0 refills | Status: CP
Start: 2019-06-08 — End: ?
  Administered 2019-06-08: 18:00:00 16 mg via ORAL

## 2019-06-08 MED ORDER — HEPARIN, PORCINE (PF) 100 UNIT/ML IV SYRG
500 [IU] | Freq: Once | 0 refills | Status: CP
Start: 2019-06-08 — End: ?

## 2019-06-08 NOTE — Patient Instructions
Call Immediately to report the following:  Uncontrolled nausea and/or vomiting, uncontrolled pain, or unusual bleeding.  Temperature of 100.4 F or greater and/or any sign/symptom of infection (redness, warmth, tenderness)  Painful mouth or difficulty swallowing  Red, cracked, or painful hands and/or feet  Diarrhea   Swelling of arms or legs  Rash    Important Phone Numbers:  [facility] Main Number (answered 24 hours a day) [phone]  Cancer Center Scheduling (appointments) [phone] OR [phone]  [facility] (for nutritional supplements) [phone]        Port Maintenance - If you have a port, it should be flushed every 6-8 weeks when not in use.  Please check with your MD, nurse, or the scheduler.

## 2019-06-08 NOTE — Progress Notes
Cycle 5 Day 2 [facility].  Port accessed, blood returned.  OK to treat, labs OK to treat.  Tolerated infusion well.   Port de-accessed per protocol, heparinized.   Discharged in stable condition.     CHEMO NOTE  Verified chemo consent signed and in chart.    Verified initiate chemo order in O2    Blood return positive via: Port (Single)    BSA and dose double checked (agree with orders as written) with: yes     Labs/applicable tests checked: CBC and Comprehensive Metabolic Panel (CMP)    Chemo regime: Drug/cycle/day Cycle 5 Day 2 [place]     Rate verified and armband double checkwith second RN: yes see MAR    Patient education offered and stated understanding. Denies questions at this time.

## 2019-06-18 ENCOUNTER — Encounter: Admit: 2019-06-18 | Discharge: 2019-06-18 | Payer: MEDICARE

## 2019-07-02 ENCOUNTER — Encounter: Admit: 2019-07-02 | Discharge: 2019-07-03 | Payer: MEDICARE

## 2019-07-02 LAB — COVID-19 (SARS-COV-2) PCR

## 2019-07-03 DIAGNOSIS — Z1159 Encounter for screening for other viral diseases: Principal | ICD-10-CM

## 2019-07-04 ENCOUNTER — Encounter: Admit: 2019-07-04 | Discharge: 2019-07-04 | Payer: MEDICARE

## 2019-07-05 ENCOUNTER — Encounter: Admit: 2019-07-05 | Discharge: 2019-07-05 | Payer: MEDICARE

## 2019-07-05 DIAGNOSIS — I10 Essential (primary) hypertension: Secondary | ICD-10-CM

## 2019-07-05 DIAGNOSIS — C8311 Mantle cell lymphoma, lymph nodes of head, face, and neck: Secondary | ICD-10-CM

## 2019-07-05 DIAGNOSIS — C61 Malignant neoplasm of prostate: Secondary | ICD-10-CM

## 2019-07-05 DIAGNOSIS — E785 Hyperlipidemia, unspecified: Secondary | ICD-10-CM

## 2019-07-05 MED ORDER — DIPHENHYDRAMINE HCL 25 MG PO CAP
25 mg | Freq: Once | ORAL | 0 refills | Status: CP
Start: 2019-07-05 — End: ?
  Administered 2019-07-05: 15:00:00 25 mg via ORAL

## 2019-07-05 MED ORDER — ACETAMINOPHEN 325 MG PO TAB
650 mg | Freq: Once | ORAL | 0 refills | Status: CP
Start: 2019-07-05 — End: ?
  Administered 2019-07-05: 15:00:00 650 mg via ORAL

## 2019-07-05 MED ORDER — MONTELUKAST 10 MG PO TAB
10 mg | Freq: Once | ORAL | 0 refills | Status: CP
Start: 2019-07-05 — End: ?
  Administered 2019-07-05: 15:00:00 10 mg via ORAL

## 2019-07-05 MED ORDER — ONDANSETRON HCL 8 MG PO TAB
16 mg | Freq: Once | ORAL | 0 refills | Status: CP
Start: 2019-07-05 — End: ?
  Administered 2019-07-05: 15:00:00 16 mg via ORAL

## 2019-07-05 MED ORDER — DEXAMETHASONE 6 MG PO TAB
12 mg | Freq: Once | ORAL | 0 refills | Status: CP
Start: 2019-07-05 — End: ?
  Administered 2019-07-05: 15:00:00 12 mg via ORAL

## 2019-07-05 MED ORDER — HEPARIN, PORCINE (PF) 100 UNIT/ML IV SYRG
500 [IU] | Freq: Once | 0 refills | Status: CP
Start: 2019-07-05 — End: ?

## 2019-07-05 MED ORDER — BENDAMUSTINE (BENDEKA) IVPB
90 mg/m2 | Freq: Once | INTRAVENOUS | 0 refills | Status: CP
Start: 2019-07-05 — End: ?
  Administered 2019-07-05 (×2): 200 mg via INTRAVENOUS

## 2019-07-05 MED ORDER — RITUXIMAB-PVVR IVPB
500 mg/m2 | Freq: Once | INTRAVENOUS | 0 refills | Status: CP
Start: 2019-07-05 — End: ?
  Administered 2019-07-05 (×3): 1100 mg via INTRAVENOUS

## 2019-07-05 NOTE — Progress Notes
Cycle 6 Day 1 [facility] [employer] accessed, labs collected.  Patient to follow up with Morey Hummingbird, NP.  OK to treat, labs OK to treat.  Tolerated infusionsell.   Port de-accessed per protocol, heparinized.   Discharged in stable condition.     CHEMO NOTE  Verified chemo consent signed and in chart.    Verified initiate chemo order in O2    Blood return positive via: Port (Single and Accessed)    BSA and dose double checked (agree with orders as written) with: yes     Labs/applicable tests checked: CBC and Comprehensive Metabolic Panel (CMP)    Chemo regime: Drug/cycle/day [facility] [place] C6 D1    Rate verified and armband double checkwith second RN: yes, see EMAR    Patient education offered and stated understanding. Denies questions at this time.

## 2019-07-05 NOTE — Progress Notes
Name: Robert Williams          MRN: [id number]      DOB: 1945-10-07      AGE: 74 y.o.   DATE OF SERVICE: 07/05/2019    Subjective:             Reason for Visit:  Cancer Follow up      Robert Williams is a 74 y.o. male.     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 Mikey Bussing with mantle cell lymphoma.??He is here prior to cycle 6 of bendamustine?and?Rituxan.  ?  He is doing well with no new complaints. 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  His adenopathy in his neck is resolved. ?He is active. ?No HEENT, cardiovascular, pulmonary, dermatology, GU or GI complaints.?  ?  1. ?Presented August 2020 with worsening left supraclavicular adenopathy. ?Initial chest x-ray imaging showed pathologically enlarged nodes.  2. ?01/13/2019 CT scans of the neck and chest showed bilateral cervical and supraclavicular adenopathy.  3. ?01/17/2019 FNA of supraclavicular node revealed CD5-positive monoclonal B-cell population consistent with mantle cell lymphoma. ?FISH for t(11;14) was positive, confirming the diagnosis.  4. ?02/07/2019 PET/CT revealed hypermetabolic adenopathy above and below the diaphragm with no extranodal sites of disease. ?Staging bone marrow biopsy was negative.  5. ?02/15/2019 BR started  He is alone.     Review of Systems   Constitutional: Positive for fatigue. Negative for fever.   Respiratory: Negative.    Cardiovascular: Negative.    Gastrointestinal: Negative.    Genitourinary: Negative.    Musculoskeletal: Negative.    Neurological: Negative.    Hematological: Negative.    Psychiatric/Behavioral: Negative.          Objective:         ? acyclovir (ZOVIRAX) 800 mg tablet Take one tablet by mouth every 12 hours. ? aspirin 325 mg tablet Take 1 Tab by mouth daily.   ? losartan-hydrochlorothiazide (HYZAAR) 50-12.5 mg tablet Take 1 tablet by mouth daily.   ? ondansetron (ZOFRAN) 8 mg tablet Take 1 tablet by mouth every 8 hours as needed for nasuea/vomitting.   ? trimethoprim/sulfamethoxazole (BACTRIM) 80/400 mg tablet Take one tablet by mouth daily.     Vitals:    07/05/19 0821   BP: 121/69   BP Source: Arm, Left Upper   Patient Position: Sitting   Pulse: 69   Resp: 16   Temp: 36.5 ?C (97.7 ?F)   TempSrc: Oral   SpO2: 100%   Weight: 103.1 kg (227 lb 3.2 oz)   Height: 182.9 cm (72.01)   PainSc: Zero     Body mass index is 30.81 kg/m?[name]     Pain Score: Zero         Pain Addressed:  [place] Cooperative Oncology Group performance status is 1, Restricted in physically strenuous activity but ambulatory and able to carry out work of a light or sedentary nature, e.g., light house work, office work.     Physical Exam  Vitals signs reviewed.   Constitutional:       Appearance: He is well-developed.   HENT:      Head: Normocephalic.   Neck:  Musculoskeletal: Normal range of motion.   Cardiovascular:      Rate and Rhythm: Normal rate and regular rhythm.   Pulmonary:      Effort: Pulmonary effort is normal.      Breath sounds: Normal breath sounds.   Abdominal:      Palpations: Abdomen is soft.   Musculoskeletal: Normal range of motion.   Lymphadenopathy:      Cervical: No cervical adenopathy.      Upper Body:      Right upper body: No supraclavicular or axillary adenopathy.      Left upper body: No supraclavicular or axillary adenopathy.   Skin:     General: Skin is warm and dry.   Neurological:      Mental Status: He is alert and oriented to person, place, and time.            CBC w/Diff    Lab Results   Component Value Date/Time    WBC 3.8 (L) 07/05/2019 08:20 AM    RBC 4.36 (L) 07/05/2019 08:20 AM    HGB 13.6 07/05/2019 08:20 AM    HCT 40.4 07/05/2019 08:20 AM    MCV 92.9 07/05/2019 08:20 AM    MCH 31.3 07/05/2019 08:20 AM MCHC 33.7 07/05/2019 08:20 AM    RDW 15.2 (H) 07/05/2019 08:20 AM    PLTCT 228 07/05/2019 08:20 AM    MPV 7.7 07/05/2019 08:20 AM    Lab Results   Component Value Date/Time    NEUT 73 07/05/2019 08:20 AM    ANC 2.80 07/05/2019 08:20 AM    LYMA 10 (L) 07/05/2019 08:20 AM    ALC 0.40 (L) 07/05/2019 08:20 AM    MONA 14 (H) 07/05/2019 08:20 AM    AMC 0.50 07/05/2019 08:20 AM    EOSA 2 07/05/2019 08:20 AM    AEC 0.10 07/05/2019 08:20 AM    BASA 1 07/05/2019 08:20 AM    ABC 0.00 07/05/2019 08:20 AM             Assessment and Plan:       Stage III mantle cell lymphoma.??He will proceed with cycle 6 of BR.  He will have scans prior to seeing Dr. Mikey Bussing as scheduled.  2. COVID vaccine #2 07/20/19.  3. 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 He will postpone dental work until after treatment completed.  5. He will continue acyclovir 800 mg twice daily.  6. He will continue Bactrim single strength daily.  7. He is up-to-date with his primary doctor.  He is not on Lipitor and has not had his cholesterol checked for quite a while.  Since he fasted this morning I will add it to our labs and send it to Dr. Herschell Dimes.   ?  ?  ?

## 2019-07-05 NOTE — Patient Instructions
Call Immediately to report the following:  Uncontrolled nausea and/or vomiting, uncontrolled pain, or unusual bleeding.  Temperature of 100.4 F or greater and/or any sign/symptom of infection (redness, warmth, tenderness)  Painful mouth or difficulty swallowing  Red, cracked, or painful hands and/or feet  Diarrhea   Swelling of arms or legs  Rash    Important Phone Numbers:  [facility] Main Number (answered 24 hours a day) [phone]  Cancer Center Scheduling (appointments) [phone] OR [phone]  [facility] (for nutritional supplements) [phone]        Port Maintenance - If you have a port, it should be flushed every 6-8 weeks when not in use.  Please check with your MD, nurse, or the scheduler.

## 2019-07-06 ENCOUNTER — Encounter: Admit: 2019-07-06 | Discharge: 2019-07-06 | Payer: MEDICARE

## 2019-07-06 DIAGNOSIS — E785 Hyperlipidemia, unspecified: Secondary | ICD-10-CM

## 2019-07-06 DIAGNOSIS — I10 Essential (primary) hypertension: Secondary | ICD-10-CM

## 2019-07-06 DIAGNOSIS — C61 Malignant neoplasm of prostate: Secondary | ICD-10-CM

## 2019-07-06 DIAGNOSIS — C8311 Mantle cell lymphoma, lymph nodes of head, face, and neck: Secondary | ICD-10-CM

## 2019-07-06 MED ORDER — HEPARIN, PORCINE (PF) 100 UNIT/ML IV SYRG
500 [IU] | Freq: Once | 0 refills | Status: CP
Start: 2019-07-06 — End: ?

## 2019-07-06 MED ORDER — ONDANSETRON HCL 8 MG PO TAB
16 mg | Freq: Once | ORAL | 0 refills | Status: CP
Start: 2019-07-06 — End: ?
  Administered 2019-07-06: 15:00:00 16 mg via ORAL

## 2019-07-06 MED ORDER — DEXAMETHASONE 4 MG PO TAB
12 mg | Freq: Once | ORAL | 0 refills | Status: CP
Start: 2019-07-06 — End: ?
  Administered 2019-07-06: 15:00:00 12 mg via ORAL

## 2019-07-06 MED ORDER — BENDAMUSTINE (BENDEKA) IVPB
90 mg/m2 | Freq: Once | INTRAVENOUS | 0 refills | Status: CP
Start: 2019-07-06 — End: ?
  Administered 2019-07-06 (×2): 200 mg via INTRAVENOUS

## 2019-07-06 NOTE — Progress Notes
Day 2 Cycle 6 [facility]     Port accessed, brisk blood return noted.   OK to treat, labs OK to treat.  Tolerated infusion well.   Port de-accessed per protocol, heparinized.   Discharged in stable condition.     CHEMO NOTE  Verified chemo consent signed and in chart.    Verified initiate chemo order in O2    Blood return positive via: Port (Single and Accessed)    BSA and dose double checked (agree with orders as written) with: yes     Labs/applicable tests checked: CBC and Comprehensive Metabolic Panel (CMP)    Chemo regime: Drug/cycle/day Verl Dicker C6D2    Rate verified and armband double checkwith second RN: yes, see EMAR.     Patient education offered and stated understanding. Denies questions at this time.

## 2019-08-14 ENCOUNTER — Encounter: Admit: 2019-08-14 | Discharge: 2019-08-14 | Payer: MEDICARE

## 2019-08-14 DIAGNOSIS — C8311 Mantle cell lymphoma, lymph nodes of head, face, and neck: Secondary | ICD-10-CM

## 2019-08-14 LAB — CBC AND DIFF
Lab: 0 10*3/uL (ref 0–0.20)
Lab: 4.7 K/UL (ref 4.5–11.0)
Lab: 4.7 M/UL (ref 4.4–5.5)

## 2019-08-14 LAB — COMPREHENSIVE METABOLIC PANEL
Lab: 0.4 mg/dL — ABNORMAL HIGH (ref 0.3–1.2)
Lab: 1 mg/dL (ref >60)
Lab: 105 MMOL/L (ref 98–110)
Lab: 109 mg/dL — ABNORMAL HIGH (ref 70–100)
Lab: 14 mg/dL (ref 7–25)
Lab: 140 MMOL/L (ref 137–147)
Lab: 17 U/L (ref 7–40)
Lab: 18 U/L (ref 7–56)
Lab: 26 MMOL/L (ref 21–30)
Lab: 3.9 MMOL/L (ref 3.5–5.1)
Lab: 4.3 g/dL — ABNORMAL LOW (ref 3.5–5.0)
Lab: 6.7 g/dL (ref 6.0–8.0)
Lab: 64 U/L (ref 25–110)
Lab: 9 10*3/uL — ABNORMAL LOW (ref 3–12)
Lab: 9.6 mg/dL (ref >60)
Lab: >60 mL/min (ref >60)
Lab: >60 mL/min (ref >60)

## 2019-08-14 LAB — POC GLUCOSE: Lab: 108 mg/dL — ABNORMAL HIGH (ref 70–100)

## 2019-08-14 MED ORDER — RP DX F-18 FDG MCI
10 | Freq: Once | INTRAVENOUS | 0 refills | Status: CP
Start: 2019-08-14 — End: ?
  Administered 2019-08-14: 17:00:00 11.8 via INTRAVENOUS

## 2019-08-15 ENCOUNTER — Encounter: Admit: 2019-08-15 | Discharge: 2019-08-15 | Payer: MEDICARE

## 2019-08-16 ENCOUNTER — Encounter: Admit: 2019-08-16 | Discharge: 2019-08-16 | Payer: MEDICARE

## 2019-08-16 DIAGNOSIS — C8311 Mantle cell lymphoma, lymph nodes of head, face, and neck: Secondary | ICD-10-CM

## 2019-08-16 DIAGNOSIS — I10 Essential (primary) hypertension: Secondary | ICD-10-CM

## 2019-08-16 DIAGNOSIS — E785 Hyperlipidemia, unspecified: Secondary | ICD-10-CM

## 2019-08-16 DIAGNOSIS — C61 Malignant neoplasm of prostate: Secondary | ICD-10-CM

## 2019-08-16 DIAGNOSIS — Z20822 Encounter for screening laboratory testing for COVID-19 virus in asymptomatic patient: Secondary | ICD-10-CM

## 2019-08-16 MED ORDER — ACETAMINOPHEN 325 MG PO TAB
650 mg | Freq: Once | ORAL | 0 refills | Status: CN
Start: 2019-08-16 — End: ?

## 2019-08-16 MED ORDER — ACETAMINOPHEN 325 MG PO TAB
650 mg | Freq: Once | ORAL | 0 refills
Start: 2019-08-16 — End: ?

## 2019-08-16 MED ORDER — DIPHENHYDRAMINE HCL 25 MG PO CAP
25 mg | Freq: Once | ORAL | 0 refills
Start: 2019-08-16 — End: ?

## 2019-08-16 MED ORDER — ACYCLOVIR 800 MG PO TAB
800 mg | ORAL_TABLET | Freq: Two times a day (BID) | ORAL | 3 refills | Status: AC
Start: 2019-08-16 — End: ?

## 2019-08-16 MED ORDER — DIPHENHYDRAMINE HCL 25 MG PO CAP
25 mg | Freq: Once | ORAL | 0 refills | Status: CN
Start: 2019-08-16 — End: ?

## 2019-08-16 MED ORDER — SULFAMETHOXAZOLE-TRIMETHOPRIM 400-80 MG PO TAB
1 | ORAL_TABLET | Freq: Every day | ORAL | 3 refills | Status: AC
Start: 2019-08-16 — End: ?

## 2019-09-03 ENCOUNTER — Ambulatory Visit: Admit: 2019-09-03 | Discharge: 2019-09-04 | Payer: MEDICARE

## 2019-09-03 DIAGNOSIS — Z20822 Encounter for screening laboratory testing for COVID-19 virus in asymptomatic patient: Secondary | ICD-10-CM

## 2019-09-06 ENCOUNTER — Encounter: Admit: 2019-09-06 | Discharge: 2019-09-06 | Payer: MEDICARE

## 2019-09-06 DIAGNOSIS — C8311 Mantle cell lymphoma, lymph nodes of head, face, and neck: Secondary | ICD-10-CM

## 2019-09-06 MED ORDER — DEXAMETHASONE 4 MG PO TAB
2 mg | Freq: Once | ORAL | 0 refills | Status: CP
Start: 2019-09-06 — End: ?
  Administered 2019-09-06: 18:00:00 2 mg via ORAL

## 2019-09-06 MED ORDER — ACETAMINOPHEN 325 MG PO TAB
650 mg | Freq: Once | ORAL | 0 refills | Status: CP
Start: 2019-09-06 — End: ?
  Administered 2019-09-06: 18:00:00 650 mg via ORAL

## 2019-09-06 MED ORDER — HEPARIN, PORCINE (PF) 100 UNIT/ML IV SYRG
500 [IU] | Freq: Once | 0 refills | Status: CP
Start: 2019-09-06 — End: ?

## 2019-09-06 MED ORDER — RIXUXIMAB-PVVR IVPB (SPEC VOL)
375 mg/m2 | Freq: Once | INTRAVENOUS | 0 refills | Status: CP
Start: 2019-09-06 — End: ?
  Administered 2019-09-06 (×3): 850 mg via INTRAVENOUS

## 2019-09-06 MED ORDER — DIPHENHYDRAMINE HCL 25 MG PO CAP
25 mg | Freq: Once | ORAL | 0 refills | Status: CP
Start: 2019-09-06 — End: ?
  Administered 2019-09-06: 18:00:00 25 mg via ORAL

## 2019-09-07 ENCOUNTER — Encounter: Admit: 2019-09-07 | Discharge: 2019-09-07 | Payer: MEDICARE

## 2019-09-07 LAB — IMMUNOGLOBULINS-IGA,IGG,IGM
Lab: 186 mg/dL (ref 70–390)
Lab: 778 mg/dL (ref 762–1488)
Lab: <20 mg/dL — ABNORMAL LOW (ref 38–328)

## 2019-10-01 ENCOUNTER — Encounter: Admit: 2019-10-01 | Discharge: 2019-10-01 | Payer: MEDICARE

## 2019-10-10 ENCOUNTER — Encounter: Admit: 2019-10-10 | Discharge: 2019-10-10 | Payer: MEDICARE

## 2019-10-29 ENCOUNTER — Encounter: Admit: 2019-10-29 | Discharge: 2019-10-29 | Payer: MEDICARE

## 2019-10-29 DIAGNOSIS — Z20822 Encounter for screening laboratory testing for COVID-19 virus in asymptomatic patient: Secondary | ICD-10-CM

## 2019-10-31 ENCOUNTER — Encounter: Admit: 2019-10-31 | Discharge: 2019-10-31 | Payer: MEDICARE

## 2019-11-01 ENCOUNTER — Encounter: Admit: 2019-11-01 | Discharge: 2019-11-01 | Payer: MEDICARE

## 2019-11-01 DIAGNOSIS — E785 Hyperlipidemia, unspecified: Secondary | ICD-10-CM

## 2019-11-01 DIAGNOSIS — I10 Essential (primary) hypertension: Secondary | ICD-10-CM

## 2019-11-01 DIAGNOSIS — C8311 Mantle cell lymphoma, lymph nodes of head, face, and neck: Secondary | ICD-10-CM

## 2019-11-01 DIAGNOSIS — C61 Malignant neoplasm of prostate: Secondary | ICD-10-CM

## 2019-11-01 MED ORDER — DIPHENHYDRAMINE HCL 25 MG PO CAP
25 mg | Freq: Once | ORAL | 0 refills | Status: CP
Start: 2019-11-01 — End: ?
  Administered 2019-11-01: 16:00:00 25 mg via ORAL

## 2019-11-01 MED ORDER — DEXAMETHASONE 4 MG PO TAB
2 mg | Freq: Once | ORAL | 0 refills | Status: CP
Start: 2019-11-01 — End: ?
  Administered 2019-11-01: 16:00:00 2 mg via ORAL

## 2019-11-01 MED ORDER — HEPARIN, PORCINE (PF) 100 UNIT/ML IV SYRG
500 [IU] | Freq: Once | 0 refills | Status: CP
Start: 2019-11-01 — End: ?

## 2019-11-01 MED ORDER — RIXUXIMAB-PVVR IVPB (SPEC VOL)
375 mg/m2 | Freq: Once | INTRAVENOUS | 0 refills | Status: CP
Start: 2019-11-01 — End: ?
  Administered 2019-11-01 (×2): 850 mg via INTRAVENOUS

## 2019-11-01 MED ORDER — ACETAMINOPHEN 325 MG PO TAB
650 mg | Freq: Once | ORAL | 0 refills | Status: CP
Start: 2019-11-01 — End: ?
  Administered 2019-11-01: 16:00:00 650 mg via ORAL

## 2019-11-01 NOTE — Progress Notes
Name: Robert Williams          MRN: [id number]      DOB: Apr 05, 1946      AGE: 74 y.o.   DATE OF SERVICE: 11/01/2019    Subjective:             Reason for Visit:  Cancer Follow up      Robert Williams is a 74 y.o. male.     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 Mikey Williams with mantle cell lymphoma.  He completed 6 cycles of of bendamustine and Rituxan.  He obtained a CR.  He is here for maintenance Rituxan.     He is doing well with no new complaints.  He is dancing on weekends. 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  His adenopathy in his neck is resolved.  He is active.  No HEENT, cardiovascular, pulmonary, dermatology, GU or GI complaints.      1.  Presented August 2020 with worsening left supraclavicular adenopathy.  Initial chest x-ray imaging showed pathologically enlarged nodes.  2.  01/13/2019 CT scans of the neck and chest showed bilateral cervical and supraclavicular adenopathy.  3.  01/17/2019 FNA of supraclavicular node revealed CD5-positive monoclonal B-cell population consistent with mantle cell lymphoma.  FISH for t(11;14) was positive, confirming the diagnosis.  4.  02/07/2019 PET/CT revealed hypermetabolic adenopathy above and below the diaphragm with no extranodal sites of disease.  Staging bone marrow biopsy was negative.  5.  02/15/2019 BR started, completed 6 cycles with CR.    He is alone.       Review of Systems      Objective:         ? acyclovir (ZOVIRAX) 800 mg tablet Take one tablet by mouth every 12 hours.   ? aspirin 325 mg tablet Take 1 Tab by mouth daily.   ? losartan-hydrochlorothiazide (HYZAAR) 50-12.5 mg tablet Take 1 tablet by mouth daily.   ? ondansetron (ZOFRAN) 8 mg tablet Take 1 tablet by mouth every 8 hours as needed for nasuea/vomitting.   ? trimethoprim/sulfamethoxazole (BACTRIM) 80/400 mg tablet Take one tablet by mouth daily.     Vitals:    11/01/19 0950   BP: 130/58   BP Source: Arm, Left Upper   Patient Position: Sitting   Pulse: 79   Resp: 16   Temp: 36.6 ?C (97.9 ?F)   TempSrc: Oral   SpO2: 98%   Weight: 101.8 kg (224 lb 6.4 oz)   Height: 182.9 cm (72.01)   PainSc: Zero     Body mass index is 30.43 kg/m?[name]     Pain Score: Zero         Pain Addressed:  [place] Cooperative Oncology Group performance status is 0, Fully active, able to carry on all pre-disease performance without restriction.[name]     Physical Exam  Vitals reviewed.   Constitutional:       Appearance: He is well-developed.   HENT:      Head: Normocephalic.   Cardiovascular:      Rate and Rhythm: Normal rate and regular rhythm.   Pulmonary:      Effort: Pulmonary effort  is normal.      Breath sounds: Normal breath sounds.   Abdominal:      Palpations: Abdomen is soft.   Musculoskeletal:         General: Normal range of motion.      Cervical back: Normal range of motion.   Lymphadenopathy:      Cervical: No cervical adenopathy.      Upper Body:      Right upper body: No supraclavicular or axillary adenopathy.      Left upper body: No supraclavicular or axillary adenopathy.   Skin:     General: Skin is warm and dry.   Neurological:      Mental Status: He is alert and oriented to person, place, and time.            CBC w/Diff    Lab Results   Component Value Date/Time    WBC 5.2 11/01/2019 09:19 AM    RBC 4.82 11/01/2019 09:19 AM    HGB 14.9 11/01/2019 09:19 AM    HCT 45.4 11/01/2019 09:19 AM    MCV 94.2 11/01/2019 09:19 AM    MCH 31.0 11/01/2019 09:19 AM    MCHC 32.9 11/01/2019 09:19 AM    RDW 14.3 11/01/2019 09:19 AM    PLTCT 254 11/01/2019 09:19 AM    MPV 8.1 11/01/2019 09:19 AM    Lab Results   Component Value Date/Time    NEUT 76 11/01/2019 09:19 AM    ANC 3.90 11/01/2019 09:19 AM    LYMA 11 (L) 11/01/2019 09:19 AM    ALC 0.60 (L) 11/01/2019 09:19 AM    MONA 10 11/01/2019 09:19 AM    AMC 0.50 11/01/2019 09:19 AM    EOSA 2 11/01/2019 09:19 AM    AEC 0.10 11/01/2019 09:19 AM    BASA 1 11/01/2019 09:19 AM    ABC 0.00 11/01/2019 09:19 AM             Assessment and Plan:             1. Stage III mantle cell lymphoma.  He completed 6 cycles of BR and obtained CR.   He will receive maintenance Rituxan today.  He will follow-up with Dr. Mikey Williams as scheduled.  He is doing well and has no residual side effects from treatment.  He remains active.  2. He has received both COVID-19 vaccines.  3. 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 He will continue acyclovir 800 mg twice daily.  5. He will continue Bactrim single strength daily.  6. His last CD4 count in April 2021 was 94.  We plan to monitor his immunoglobulins and CD4 count every 6 months.  7. He will continue to follow with his primary doctor, Dr. Herschell Williams

## 2019-11-01 NOTE — Patient Instructions
Call Immediately to report the following:  Uncontrolled nausea and/or vomiting, uncontrolled pain, or unusual bleeding.  Temperature of 100.4 F or greater and/or any sign/symptom of infection (redness, warmth, tenderness)  Painful mouth or difficulty swallowing  Red, cracked, or painful hands and/or feet  Diarrhea   Swelling of arms or legs  Rash    Important Phone Numbers:  [facility] Main Number (answered 24 hours a day) [phone]  Cancer Center Scheduling (appointments) [phone] OR [phone]  [facility] (for nutritional supplements) [phone]        Port Maintenance - If you have a port, it should be flushed every 6-8 weeks when not in use.  Please check with your MD, nurse, or the scheduler.

## 2019-11-01 NOTE — Progress Notes
CHEMO NOTE  Verified chemo consent signed and in chart.    Verified initiate chemo order in O2    Blood return positive via: Port (Single)    BSA and dose double checked (agree with orders as written) with: yes with Deb, RN    Labs/applicable tests checked: CBC and Comprehensive Metabolic Panel (CMP)    Chemo regime: C57D1 Rituximab    Rate verified and armband double checkwith second RN: yes    Patient education offered and stated understanding. Denies questions at this time.      Patient presented to clinic for treatment. Patient's port accessed with positive blood return. Patient's labs okay to treat. Patient tolerated infusion well. Patient's port flushed with heparin and de-accessed per protocol. Patient discharged off unit via ambulation in good condition.

## 2019-12-16 ENCOUNTER — Encounter: Admit: 2019-12-16 | Discharge: 2019-12-16 | Payer: MEDICARE

## 2019-12-25 ENCOUNTER — Encounter: Admit: 2019-12-24 | Discharge: 2019-12-25 | Payer: MEDICARE

## 2019-12-25 DIAGNOSIS — Z20822 Contact with and (suspected) exposure to covid-19: Secondary | ICD-10-CM

## 2019-12-27 ENCOUNTER — Encounter: Admit: 2019-12-27 | Discharge: 2019-12-27 | Payer: MEDICARE

## 2019-12-27 DIAGNOSIS — C8311 Mantle cell lymphoma, lymph nodes of head, face, and neck: Secondary | ICD-10-CM

## 2019-12-27 DIAGNOSIS — I10 Essential (primary) hypertension: Secondary | ICD-10-CM

## 2019-12-27 DIAGNOSIS — E785 Hyperlipidemia, unspecified: Secondary | ICD-10-CM

## 2019-12-27 DIAGNOSIS — C61 Malignant neoplasm of prostate: Secondary | ICD-10-CM

## 2019-12-27 LAB — IMMUNOGLOBULINS-IGA,IGG,IGM
Lab: 183 mg/dL (ref 70–390)
Lab: 744 mg/dL — ABNORMAL LOW (ref 762–1488)
Lab: <20 mg/dL — ABNORMAL LOW (ref 38–328)

## 2019-12-27 MED ORDER — ACETAMINOPHEN 325 MG PO TAB
650 mg | Freq: Once | ORAL | 0 refills | Status: CP
Start: 2019-12-27 — End: ?
  Administered 2019-12-27: 16:00:00 650 mg via ORAL

## 2019-12-27 MED ORDER — RIXUXIMAB-PVVR IVPB (SPEC VOL)
375 mg/m2 | Freq: Once | INTRAVENOUS | 0 refills | Status: CP
Start: 2019-12-27 — End: ?
  Administered 2019-12-27 (×3): 850 mg via INTRAVENOUS

## 2019-12-27 MED ORDER — HEPARIN, PORCINE (PF) 100 UNIT/ML IV SYRG
500 [IU] | Freq: Once | 0 refills | Status: CP
Start: 2019-12-27 — End: ?

## 2019-12-27 MED ORDER — DIPHENHYDRAMINE HCL 25 MG PO CAP
25 mg | Freq: Once | ORAL | 0 refills | Status: CP
Start: 2019-12-27 — End: ?
  Administered 2019-12-27: 16:00:00 25 mg via ORAL

## 2019-12-27 MED ORDER — DEXAMETHASONE 4 MG PO TAB
2 mg | Freq: Once | ORAL | 0 refills | Status: CP
Start: 2019-12-27 — End: ?
  Administered 2019-12-27: 16:00:00 2 mg via ORAL

## 2019-12-27 NOTE — Progress Notes
Pt here for [employer], Provider and Rituxan infusion.   Port accessed, labs drawn, flushed per protocol.   Pt seen by provider.  No additional assessment findings.   CHEMO NOTE  Verified chemo consent signed and in chart.    Verified initiate chemo order in O2    Blood return positive via: Port (Single)    BSA and dose double checked (agree with orders as written) with: yes A Gahagan    Labs/applicable tests checked: CBC and Comprehensive Metabolic Panel (CMP)    Chemo regime: Drug/cycle/day [id number] Rituxan 850mg  IVPB    Rate verified and armband double checkwith second RN: yes see MAR    Patient education offered and stated understanding. Denies questions at this time.    Pt tolerated infusion without adverse event.  Pt discharged stable to home.

## 2019-12-27 NOTE — Progress Notes
Name: Elio Gillenwater          MRN: [id number]      DOB: 1946-01-18      AGE: 74 y.o.   DATE OF SERVICE: 12/27/2019    Subjective:             Reason for Visit:  Follow Up      Muaaz Routt is a 74 y.o. male.     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  Presented August 2020 with worsening left supraclavicular adenopathy.  Initial chest x-ray imaging showed pathologically enlarged nodes.  2.  01/13/2019 CT scans of the neck and chest showed bilateral cervical and supraclavicular adenopathy.  3.  01/17/2019 FNA of supraclavicular node revealed CD5-positive monoclonal B-cell population consistent with mantle cell lymphoma.  FISH for t(11;14) was positive, confirming the diagnosis.  4.  02/07/2019 PET/CT revealed hypermetabolic adenopathy above and below the diaphragm with no extranodal sites of disease.  Staging bone marrow biopsy was negative.  5.  Sept 2020 - March 2021 BR x 6 with CR.  6.  Rituximab maintenance    Interim History:  I feel great.  Only new symptom is numbness bilateral cheeks and chin.  No dysphagia or odynophagia and no visual symptoms.  Nodes all remain resolved.  No fevers or infections.  Continues to work full-time and has excellent energy.  Cuts his grass regularly.  Has been dancing on Friday and Saturday nights dancing.  No fevers, drenching night sweats, unintentional weight loss.  No interim hospitalizations or transfusions.    I have reviewed and updated the past medical, social and family histories in the history section and they are up to date as of this visit.    I have extensively reviewed the laboratory, pathology and radiology, both internal and external, and the key findings are summarized above.         Review of Systems   All other systems reviewed and are negative.        Objective:         ? acyclovir (ZOVIRAX) 800 mg tablet Take one tablet by mouth every 12 hours.   ? aspirin 325 mg tablet Take 1 Tab by mouth daily.   ? losartan-hydrochlorothiazide (HYZAAR) 50-12.5 mg tablet Take 1 tablet by mouth daily.   ? ondansetron (ZOFRAN) 8 mg tablet Take 1 tablet by mouth every 8 hours as needed for nasuea/vomitting.   ? trimethoprim/sulfamethoxazole (BACTRIM) 80/400 mg tablet Take one tablet by mouth daily.     Vitals:    12/27/19 0953   BP: 120/63   BP Source: Arm, Left Upper   Patient Position: Sitting   Pulse: 68   Resp: 18   Temp: 36.6 ?C (97.9 ?F)   TempSrc: Oral   SpO2: 98%   Weight: 104 kg (229 lb 3.2 oz)   Height: 182.9 cm (72.01)   PainSc: Zero     Body mass index is 31.08 kg/m?[name]     Pain Score: Zero       Fatigue Scale: 0-None    Pain Addressed:  N/A  Patient Evaluated for a Clinical Trial: Patient not eligible for a treatment trial (including not needing treatment, needs palliative care, in remission).     [place] Cooperative Oncology Group performance status is 0, Fully active, able to carry on all pre-disease performance without restriction.     Physical Exam  Vitals and nursing note reviewed.   Constitutional:       General: He is not in acute distress.  HENT:      Head: Normocephalic and atraumatic.   Eyes:      General: No scleral icterus.  Cardiovascular:      Rate and Rhythm: Normal rate and regular rhythm.   Pulmonary:      Effort: Pulmonary effort is normal.      Breath sounds: Normal breath sounds.   Abdominal:      General: There is no distension.      Palpations: Abdomen is soft. There is no mass.   Musculoskeletal:         General: Normal range of motion.      Cervical back: Neck supple.   Lymphadenopathy:      Comments:   Cervical adenopathy resolved.  No other palpable adenopathy.   Skin:     General: Skin is warm.      Findings: No rash.   Neurological:      General: No focal deficit present.      Mental Status: He is alert.   Psychiatric:         Mood and Affect: Mood normal.               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 Stage III mantle cell lymphoma  2. 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 Hypertension  4. Hyperlipidemia  5. ECOG PS 0    Plan:  Doing well.  Tolerating therapy well without issues.  He has no evidence of clinical disease progression and had obvious disease on physical exam at presentation.  Continue rituximab 375 mg/m? IV every 2 months with C3 D1 of maintenance = 12/27/2019.  Optimal duration of maintenance has not yet been established.  It was given indefinitely in the R-CHOP trial without transplant and for 3 years in the post transplant study.  I am recommending that we give him at least 2 years and monitor his quantitative immunoglobulin and CD4 count levels to determine the degree of immune suppression he is developing and utilize that as a barometer to determine whether an additional year to 3 years is necessary.  Monitor immunoglobulins and CD4 count every 6 months  Continue acyclovir 800 mg p.o. twice daily  Continue Bactrim SS daily.  Absent new or concerning symptoms, we will plan on repeating restaging imaging after 2 years of maintenance therapy.  Due to his history of lymphoma, he has an elevated risk of infections and secondary malignancies.  He additionally should have aggressive age-appropriate cancer screening.  We will investigate his vaccination history and initiate any indicated pneumococcal vaccinations or shingles vaccines after he has some immune reconstitution after BR.  He has received the coronavirus vaccine.  However, I have let him know that the delta variant has resulted in infections in vaccinated patient.  These in general are not particularly severe and do not result in hospitalization, respiratory failure, or death, but he is at risk of infection and needs to take appropriate precautions.  RTC with [name] in 2 months, with me in 4 months, or sooner should new or concerning symptoms develop.    I have discussed the diagnosis and treatment plan with the patient and he expresses understanding and wishes to proceed.

## 2019-12-27 NOTE — Patient Instructions
Call Immediately to report the following:  Uncontrolled nausea and/or vomiting, uncontrolled pain, or unusual bleeding.  Temperature of 100.4 F or greater and/or any sign/symptom of infection (redness, warmth, tenderness)  Painful mouth or difficulty swallowing  Red, cracked, or painful hands and/or feet  Diarrhea   Swelling of arms or legs  Rash    Important Phone Numbers:  [facility] Main Number (answered 24 hours a day) [phone]  Cancer Center Scheduling (appointments) [phone] OR [phone]  [facility] (for nutritional supplements) [phone]        Port Maintenance - If you have a port, it should be flushed every 6-8 weeks when not in use.  Please check with your MD, nurse, or the scheduler.

## 2020-01-05 ENCOUNTER — Encounter: Admit: 2020-01-05 | Discharge: 2020-01-05 | Payer: MEDICARE

## 2020-01-12 ENCOUNTER — Encounter: Admit: 2020-01-12 | Discharge: 2020-01-12 | Payer: MEDICARE

## 2020-01-12 MED ORDER — SULFAMETHOXAZOLE-TRIMETHOPRIM 400-80 MG PO TAB
1 | ORAL_TABLET | Freq: Every day | ORAL | 3 refills | Status: AC
Start: 2020-01-12 — End: ?

## 2020-01-12 MED ORDER — ACYCLOVIR 800 MG PO TAB
800 mg | ORAL_TABLET | Freq: Two times a day (BID) | ORAL | 3 refills | Status: AC
Start: 2020-01-12 — End: ?

## 2020-01-15 ENCOUNTER — Encounter: Admit: 2020-01-15 | Discharge: 2020-01-15 | Payer: MEDICARE

## 2020-01-15 MED ORDER — ACYCLOVIR 800 MG PO TAB
800 mg | ORAL_TABLET | Freq: Two times a day (BID) | ORAL | 3 refills | Status: AC
Start: 2020-01-15 — End: ?

## 2020-01-23 ENCOUNTER — Encounter: Admit: 2020-01-23 | Discharge: 2020-01-23 | Payer: MEDICARE

## 2020-01-23 DIAGNOSIS — Z23 Encounter for immunization: Secondary | ICD-10-CM

## 2020-02-18 ENCOUNTER — Ambulatory Visit: Admit: 2020-02-18 | Discharge: 2020-02-19 | Payer: MEDICARE

## 2020-02-18 DIAGNOSIS — Z20822 Encounter for screening laboratory testing for COVID-19 virus in asymptomatic patient: Secondary | ICD-10-CM

## 2020-02-20 ENCOUNTER — Encounter: Admit: 2020-02-20 | Discharge: 2020-02-20 | Payer: MEDICARE

## 2020-02-21 ENCOUNTER — Encounter: Admit: 2020-02-21 | Discharge: 2020-02-21 | Payer: MEDICARE

## 2020-02-21 DIAGNOSIS — E785 Hyperlipidemia, unspecified: Secondary | ICD-10-CM

## 2020-02-21 DIAGNOSIS — C61 Malignant neoplasm of prostate: Secondary | ICD-10-CM

## 2020-02-21 DIAGNOSIS — C8311 Mantle cell lymphoma, lymph nodes of head, face, and neck: Secondary | ICD-10-CM

## 2020-02-21 DIAGNOSIS — I10 Essential (primary) hypertension: Secondary | ICD-10-CM

## 2020-02-21 MED ORDER — HEPARIN, PORCINE (PF) 100 UNIT/ML IV SYRG
500 [IU] | Freq: Once | 0 refills | Status: CP
Start: 2020-02-21 — End: ?

## 2020-02-21 MED ORDER — RIXUXIMAB-PVVR IVPB (SPEC VOL)
375 mg/m2 | Freq: Once | INTRAVENOUS | 0 refills | Status: CP
Start: 2020-02-21 — End: ?
  Administered 2020-02-21 (×3): 850 mg via INTRAVENOUS

## 2020-02-21 MED ORDER — DEXAMETHASONE 4 MG PO TAB
2 mg | Freq: Once | ORAL | 0 refills | Status: CP
Start: 2020-02-21 — End: ?
  Administered 2020-02-21: 16:00:00 2 mg via ORAL

## 2020-02-21 MED ORDER — DIPHENHYDRAMINE HCL 25 MG PO CAP
25 mg | Freq: Once | ORAL | 0 refills | Status: CP
Start: 2020-02-21 — End: ?
  Administered 2020-02-21: 16:00:00 25 mg via ORAL

## 2020-02-21 MED ORDER — ACETAMINOPHEN 325 MG PO TAB
650 mg | Freq: Once | ORAL | 0 refills | Status: CP
Start: 2020-02-21 — End: ?
  Administered 2020-02-21: 16:00:00 650 mg via ORAL

## 2020-02-21 NOTE — Progress Notes
Name: Robert Williams          MRN: [id number]      DOB: 14-Dec-1945      AGE: 74 y.o.   DATE OF SERVICE: 02/21/2020    Subjective:             Reason for Visit:  Follow Up      Robert Williams is a 74 y.o. male.     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 Mikey Bussing with mantle cell lymphoma.  He completed 6 cycles of of bendamustine and Rituxan.  He obtained a CR. He is here for maintenance Rituxan.  He started maintenance Rituxan in April 2021.     He is doing well with no new complaints. 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  He has not seen his primary doctor recently.  His adenopathy in his neck is resolved. No HEENT, cardiovascular, pulmonary, dermatology, GU or GI complaints.      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  Presented August 2020 with worsening left supraclavicular adenopathy.  Initial chest x-ray imaging showed pathologically enlarged nodes.  2.  01/13/2019 CT scans of the neck and chest showed bilateral cervical and supraclavicular adenopathy.  3.  01/17/2019 FNA of supraclavicular node revealed CD5-positive monoclonal B-cell population consistent with mantle cell lymphoma.  FISH for t(11;14) was positive, confirming the diagnosis.  4.  02/07/2019 PET/CT revealed hypermetabolic adenopathy above and below the diaphragm with no extranodal sites of disease.  Staging bone marrow biopsy was negative.  5.  Sept 2020 - March 2021 BR x 6 with CR.  6.  Rituximab maintenance     He is alone.  He is married.     Review of Systems      Objective:         ? acyclovir (ZOVIRAX) 800 mg tablet Take one tablet by mouth every 12 hours.   ? aspirin 325 mg tablet Take 1 Tab by mouth daily.   ? losartan-hydrochlorothiazide (HYZAAR) 50-12.5 mg tablet Take 1 tablet by mouth daily.   ? ondansetron (ZOFRAN) 8 mg tablet Take 1 tablet by mouth every 8 hours as needed for nasuea/vomitting.   ? trimethoprim/sulfamethoxazole (BACTRIM) 80/400 mg tablet Take one tablet by mouth daily.     Vitals:    02/21/20 0954   BP: 120/64   BP Source: Arm, Left Upper   Patient Position: Sitting   Pulse: 72   Resp: 16   Temp: 36.7 ?C (98 ?F)   TempSrc: Oral   SpO2: 98%   Weight: 103.6 kg (228 lb 6.4 oz)   Height: 182.9 cm (72.01)   PainSc: Zero     Body mass index is 30.97 kg/m?[name]     Pain Score: Zero         Pain Addressed:  N/A  [place] Cooperative Oncology Group performance status is 0, Fully active, able to carry on all pre-disease performance without restriction.[name]     Physical Exam  Vitals reviewed.   Constitutional:       Appearance: He is well-developed.   HENT:      Head: Normocephalic.  Cardiovascular:      Rate and Rhythm: Normal rate and regular rhythm.   Pulmonary:      Effort: Pulmonary effort is normal.      Breath sounds: Normal breath sounds.   Abdominal:      Palpations: Abdomen is soft.   Musculoskeletal:         General: Normal range of motion.      Cervical back: Normal range of motion.   Lymphadenopathy:      Cervical: No cervical adenopathy.      Upper Body:      Right upper body: No supraclavicular or axillary adenopathy.      Left upper body: No supraclavicular or axillary adenopathy.   Skin:     General: Skin is warm and dry.   Neurological:      Mental Status: He is alert and oriented to person, place, and time.            CBC w/Diff    Lab Results   Component Value Date/Time    WBC 5.6 02/21/2020 09:32 AM    RBC 4.43 02/21/2020 09:32 AM    HGB 14.1 02/21/2020 09:32 AM    HCT 41.7 02/21/2020 09:32 AM    MCV 94.0 02/21/2020 09:32 AM    MCH 31.8 02/21/2020 09:32 AM    MCHC 33.9 02/21/2020 09:32 AM    RDW 13.8 02/21/2020 09:32 AM    PLTCT 226 02/21/2020 09:32 AM    MPV 7.8 02/21/2020 09:32 AM    Lab Results Component Value Date/Time    NEUT 77 02/21/2020 09:32 AM    ANC 4.30 02/21/2020 09:32 AM    LYMA 10 (L) 02/21/2020 09:32 AM    ALC 0.60 (L) 02/21/2020 09:32 AM    MONA 11 02/21/2020 09:32 AM    AMC 0.60 02/21/2020 09:32 AM    EOSA 1 02/21/2020 09:32 AM    AEC 0.10 02/21/2020 09:32 AM    BASA 1 02/21/2020 09:32 AM    ABC 0.10 02/21/2020 09:32 AM        Comprehensive Metabolic Profile    Lab Results   Component Value Date/Time    NA 139 02/21/2020 09:32 AM    K 4.2 02/21/2020 09:32 AM    CL 103 02/21/2020 09:32 AM    CO2 27 02/21/2020 09:32 AM    GAP 9 02/21/2020 09:32 AM    BUN 16 02/21/2020 09:32 AM    CR 0.99 02/21/2020 09:32 AM    GLU 102 (H) 02/21/2020 09:32 AM    Lab Results   Component Value Date/Time    CA 9.7 02/21/2020 09:32 AM    PO4 2.8 01/27/2019 09:14 AM    ALBUMIN 4.3 02/21/2020 09:32 AM    TOTPROT 6.7 02/21/2020 09:32 AM    ALKPHOS 68 02/21/2020 09:32 AM    AST 20 02/21/2020 09:32 AM    ALT 23 02/21/2020 09:32 AM    TOTBILI 0.6 02/21/2020 09:32 AM    GFR >60 02/21/2020 09:32 AM    GFRAA >60 02/21/2020 09:32 AM        Ref Range & Units 02/21/20 0932 07/05/19 0820   Cholesterol   <200 MG/DL 161  [id number]     Triglycerides   <150 MG/DL 098  [id number]     HDL   >40 MG/DL 46  43    LDL   <295 mg/dL [id number]   [id number]     VLDL   MG/DL 22  35    Non HDL Cholesterol  MG/DL 213  086             Assessment and Plan:    1. Stage III mantle cell lymphoma.  He completed 6 cycles of BR and obtained CR.   He started maintenance Rituxan April 2021 is receiving it every 2 months.  He will receive it today as scheduled.  He will follow-up with Dr. Mikey Bussing in December.  He is doing well and has no residual side effects from treatment.  He remains active.  2. He has received both COVID-19 vaccines.  He received his booster vaccine on 01/09/2020.  3. 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 He will continue acyclovir 800 mg twice daily.  5. He will continue Bactrim single strength daily.  6. His last CD4 count in April 2021 was 94.  We plan to monitor his immunoglobulins and CD4 count every 6 months.  We drew it today.  7. He will continue to follow with his primary doctor, Dr. Herschell Dimes.  Lipid profile was drawn today and shows improvement. He takes a small dose of atorvastatin.

## 2020-02-21 NOTE — Patient Instructions
Call Immediately to report the following:  Uncontrolled nausea and/or vomiting, uncontrolled pain, or unusual bleeding.  Temperature of 100.4 F or greater and/or any sign/symptom of infection (redness, warmth, tenderness)  Painful mouth or difficulty swallowing  Red, cracked, or painful hands and/or feet  Diarrhea   Swelling of arms or legs  Rash    Important Phone Numbers:  [facility] Main Number (answered 24 hours a day) [phone]  Cancer Center Scheduling (appointments) [phone] OR [phone]  [facility] (for nutritional supplements) [phone]        Port Maintenance - If you have a port, it should be flushed every 6-8 weeks when not in use.  Please check with your MD, nurse, or the scheduler.

## 2020-02-22 ENCOUNTER — Encounter: Admit: 2020-02-22 | Discharge: 2020-02-22 | Payer: MEDICARE

## 2020-03-09 ENCOUNTER — Encounter: Admit: 2020-03-09 | Discharge: 2020-03-09 | Payer: MEDICARE

## 2020-04-21 ENCOUNTER — Encounter: Admit: 2020-04-21 | Discharge: 2020-04-22 | Payer: MEDICARE

## 2020-04-21 ENCOUNTER — Encounter: Admit: 2020-04-21 | Discharge: 2020-04-21 | Payer: MEDICARE

## 2020-04-22 DIAGNOSIS — Z20822 Contact with and (suspected) exposure to covid-19: Principal | ICD-10-CM

## 2020-04-23 ENCOUNTER — Encounter: Admit: 2020-04-23 | Discharge: 2020-04-23 | Payer: MEDICARE

## 2020-04-24 ENCOUNTER — Encounter: Admit: 2020-04-24 | Discharge: 2020-04-24 | Payer: MEDICARE

## 2020-04-24 DIAGNOSIS — C8311 Mantle cell lymphoma, lymph nodes of head, face, and neck: Secondary | ICD-10-CM

## 2020-04-24 DIAGNOSIS — C61 Malignant neoplasm of prostate: Secondary | ICD-10-CM

## 2020-04-24 DIAGNOSIS — E785 Hyperlipidemia, unspecified: Secondary | ICD-10-CM

## 2020-04-24 DIAGNOSIS — I10 Essential (primary) hypertension: Secondary | ICD-10-CM

## 2020-04-24 MED ORDER — DIPHENHYDRAMINE HCL 25 MG PO CAP
25 mg | Freq: Once | ORAL | 0 refills | Status: CP
Start: 2020-04-24 — End: ?
  Administered 2020-04-24: 17:00:00 25 mg via ORAL

## 2020-04-24 MED ORDER — ACETAMINOPHEN 325 MG PO TAB
650 mg | Freq: Once | ORAL | 0 refills | Status: CP
Start: 2020-04-24 — End: ?
  Administered 2020-04-24: 17:00:00 650 mg via ORAL

## 2020-04-24 MED ORDER — HEPARIN, PORCINE (PF) 100 UNIT/ML IV SYRG
500 [IU] | Freq: Once | 0 refills | Status: CP
Start: 2020-04-24 — End: ?

## 2020-04-24 MED ORDER — RIXUXIMAB-PVVR IVPB (SPEC VOL)
375 mg/m2 | Freq: Once | INTRAVENOUS | 0 refills | Status: CP
Start: 2020-04-24 — End: ?
  Administered 2020-04-24 (×2): 850 mg via INTRAVENOUS

## 2020-04-24 MED ORDER — DEXAMETHASONE 4 MG PO TAB
2 mg | Freq: Once | ORAL | 0 refills | Status: CP
Start: 2020-04-24 — End: ?
  Administered 2020-04-24: 17:00:00 2 mg via ORAL

## 2020-04-24 NOTE — Progress Notes
Port accessed, labs collected.  Pt was seen by Dr.Hoffman for an office visit. Denies complaints at this time.   Plan given as ordered, tolerated well.   Port de-accessed per protocol, heparinized.   Discharged in good condition, ambulatory.     CHEMO NOTE  Verified chemo consent signed and in chart.    Verified initiate chemo order in O2    Blood return positive via: Port (Single)    BSA and dose double checked (agree with orders as written) with: yes     Labs/applicable tests checked: CBC and Comprehensive Metabolic Panel (CMP)    Chemo regime: Drug Ruxience /cycle 1 /day 225.    Rate verified and armband double checkwith second RN: yes, see MAR    Patient education offered and stated understanding. Denies questions at this time.

## 2020-04-24 NOTE — Assessment & Plan Note
Impression:  1. Stage III mantle cell lymphoma  2. 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 Hypertension  4. Hyperlipidemia  5. ECOG PS 0    Plan:  Doing well.  Tolerating therapy well without issues.  He has no evidence of clinical disease progression and had obvious disease on physical exam at presentation.  Continue rituximab 375 mg/m? IV every 2 months with C3 D1 of maintenance = 12/27/2019.  Optimal duration of maintenance has not yet been established.  It was given indefinitely in the R-CHOP trial without transplant and for 3 years in the post transplant study.  I am recommending that we give him at least 2 years and monitor his quantitative immunoglobulin and CD4 count levels to determine the degree of immune suppression he is developing and utilize that as a barometer to determine whether an additional year to 3 years is necessary.  Monitor immunoglobulins and CD4 count every 6 months  Continue acyclovir 800 mg p.o. twice daily  Continue Bactrim SS daily.  Absent new or concerning symptoms, we will plan on repeating restaging imaging after 2 years of maintenance therapy.  Due to his history of lymphoma, he has an elevated risk of infections and secondary malignancies.  He additionally should have aggressive age-appropriate cancer screening.  We will investigate his vaccination history and initiate any indicated pneumococcal vaccinations or shingles vaccines after he has some immune reconstitution after BR.  He has received the coronavirus vaccine.  However, I have let him know that the delta variant has resulted in infections in vaccinated patient.  These in general are not particularly severe and do not result in hospitalization, respiratory failure, or death, but he is at risk of infection and needs to take appropriate precautions.  RTC with [name] in 2 months, with me in 4 months, or sooner should new or concerning symptoms develop.    I have discussed the diagnosis and treatment plan with the patient and he expresses understanding and wishes to proceed.

## 2020-04-25 ENCOUNTER — Encounter: Admit: 2020-04-25 | Discharge: 2020-04-25 | Payer: MEDICARE

## 2020-04-29 IMAGING — CT CHEST W(Adult)
2 of 4 series · 12 of 36 positions shown, 15 images · IV contrast (Omnipaque)
Comparison: none

[Series 2: thorax ax 5.00 br40 s3 · axial · 0.59mm/px · z∈[+1625,+1955]mm · 9 of 78 slices shown, 12 images]
[im 6/78  mediastinal]
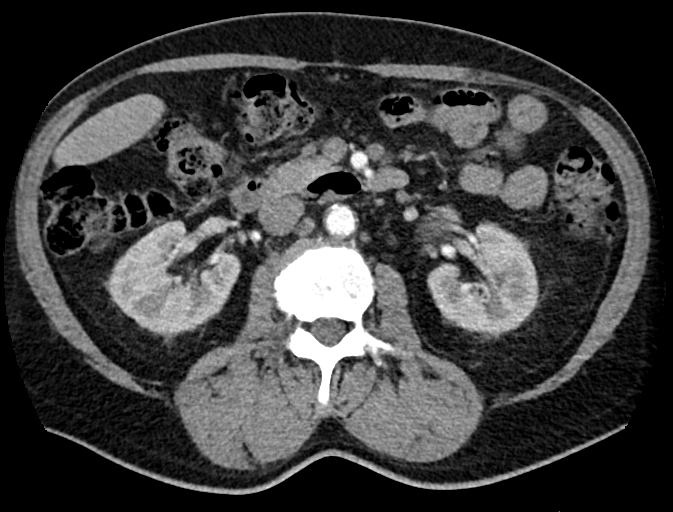
[im 6/78  lung]
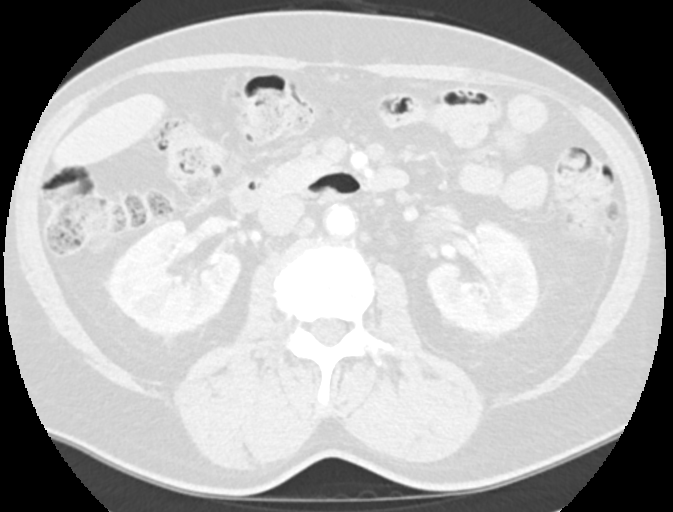
[im 18/78  lung]
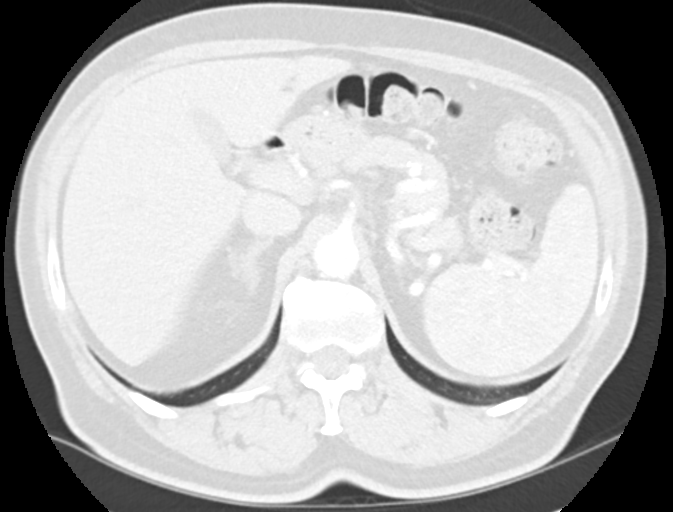
[im 24/78  lung]
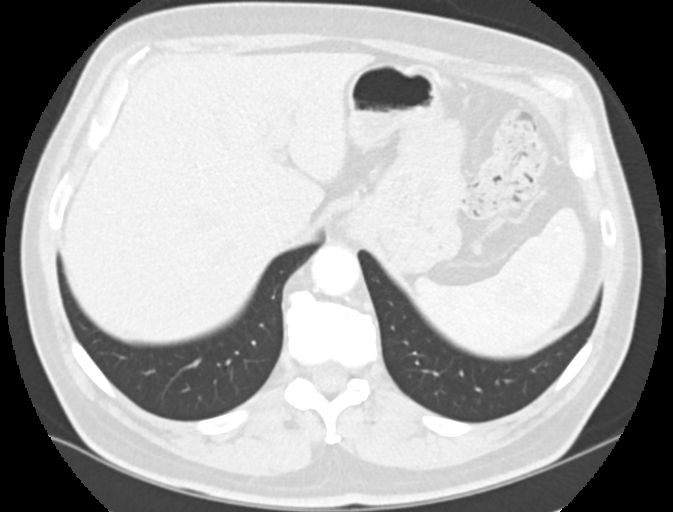
[im 30/78  lung]
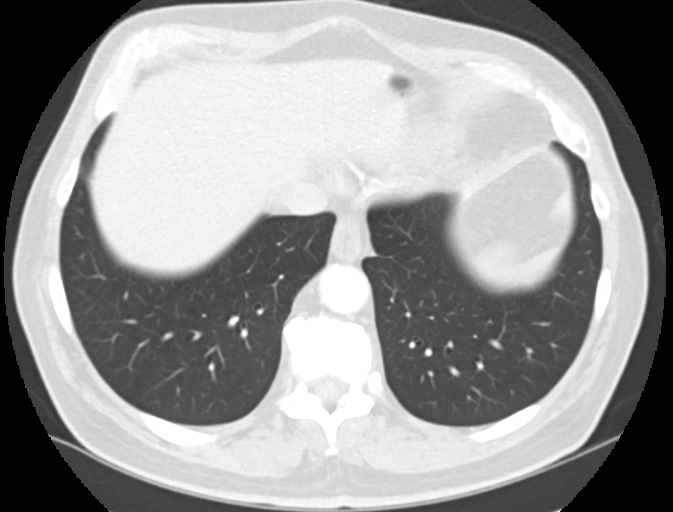
[im 42/78  mediastinal]
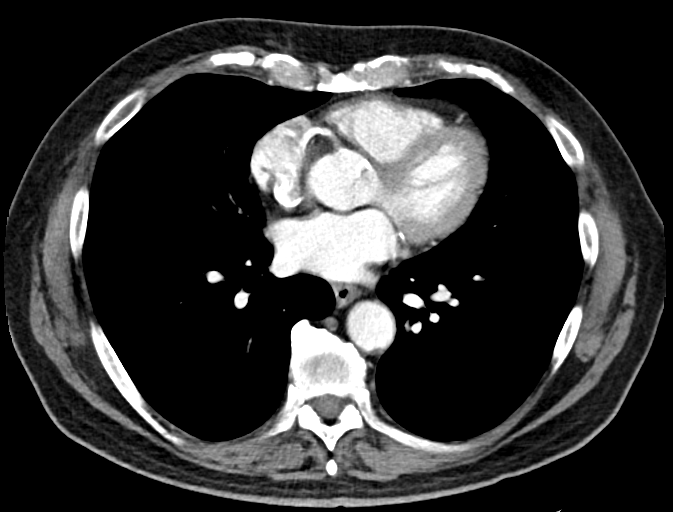
[im 42/78  lung]
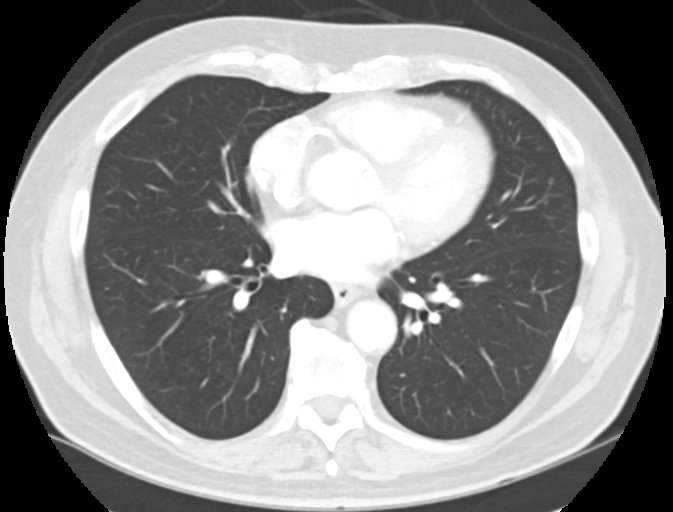
[im 48/78  lung]
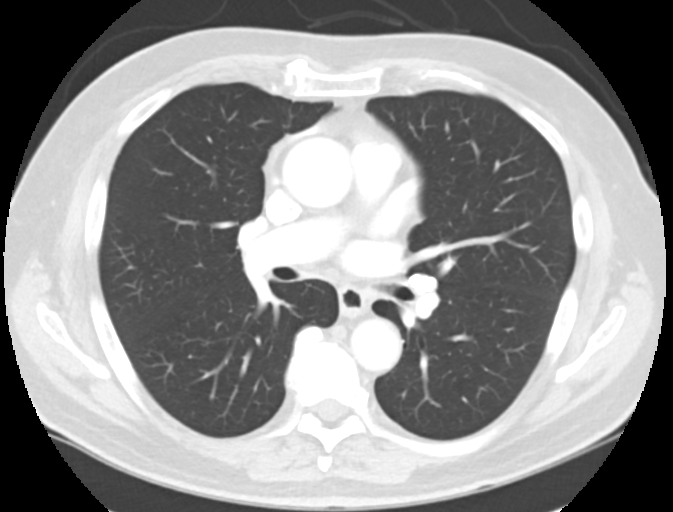
[im 54/78  lung]
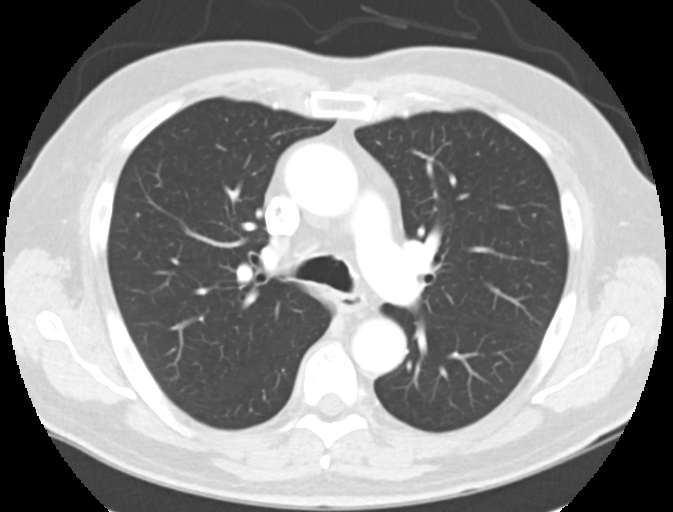
[im 66/78  lung]
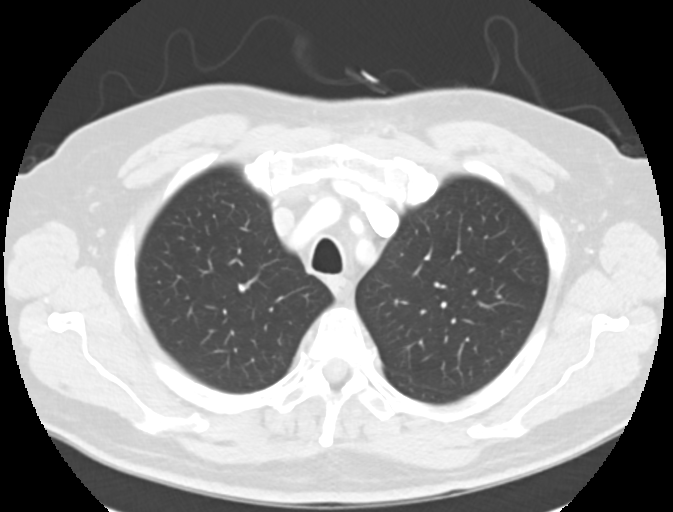
[im 72/78  mediastinal]
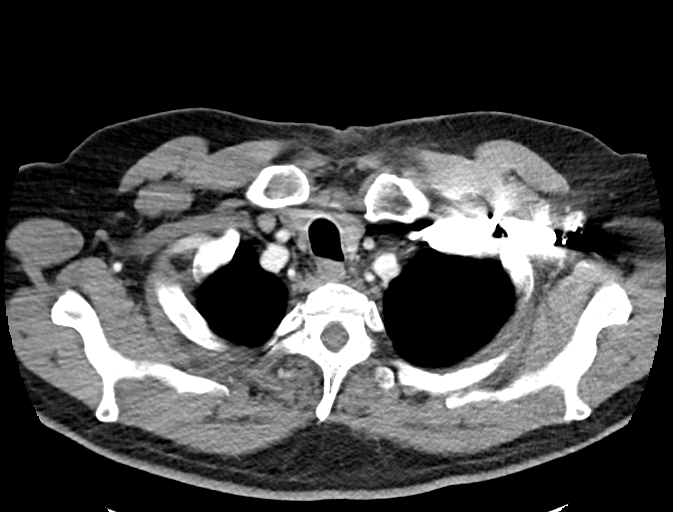
[im 72/78  lung]
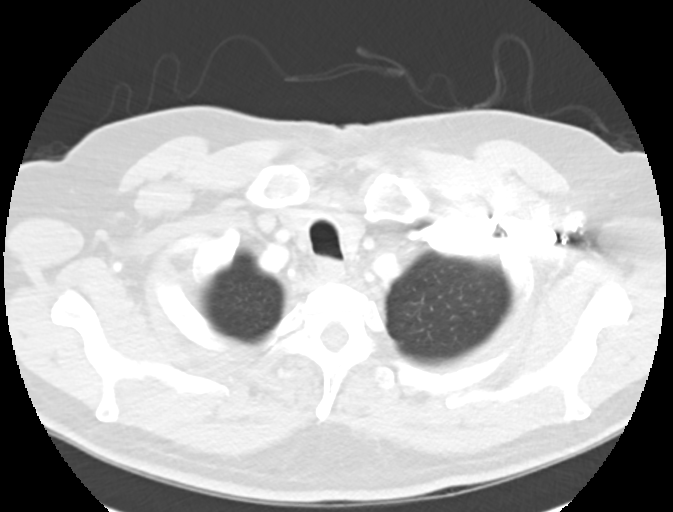

[Series 4: thorax cor 5.00 br40 s3 · coronal · 0.76mm/px · 3 of 59 slices shown]
[im 12/59  lung]
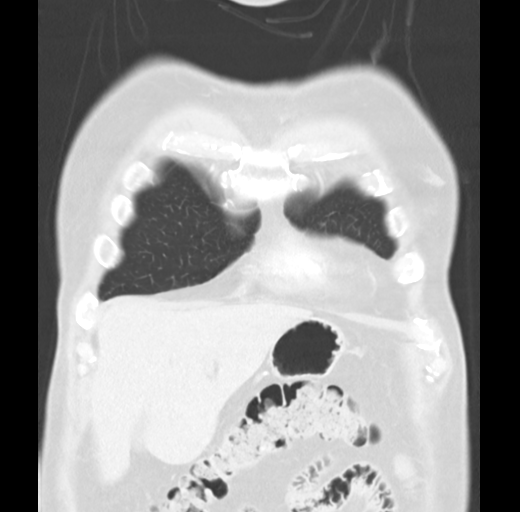
[im 24/59  lung]
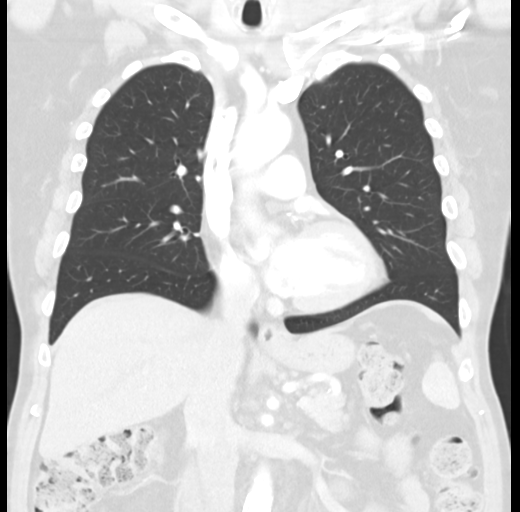
[im 35/59  lung]
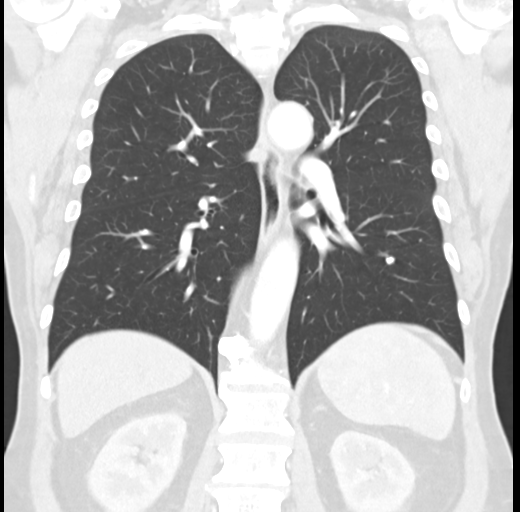

[12 of 36 positions shown; findings below may reference images not displayed]

DIAGNOSTIC STUDIES

EXAM

CT chest with contrast

INDICATION

supraclavicular lymphadenopathy
PT STATES HAS LEFT SIDE SUPRACLAVICULAR SWELLING. BILATERAL RIB CAGE TENDERNESS.  HX OF PROSTATE
CANCER. GFR 94. 9YY4B 06SYT77. CT/NM 0/0. TJ

TECHNIQUE

Volumetric multi detector CT images of the chest are obtained after the administration of  100
milliliters Omnipaque 300 low osmolar intravenous contrast

All CT scans at this facility use dose modulation, iterative reconstruction, and/or weight based
dosing when appropriate to reduce radiation dose to as low as reasonably achievable.

In the last twelve months the patient has had 0 computed tomographic studies

In the last twelve months the patient has had 0 nuclear cardiac perfusion studies

COMPARISONS

None available.

FINDINGS

The thoracic inlet  demonstrates bulky the adenopathy within the left supraclavicular and
infraclavicular fossa measuring 5.0 x 4.3 cm in greatest axial dimension on series 2, image 1.. The
thoracic aorta is non aneurysmal. There is      no central filling defect to suggest pulmonary
embolus .  Otherwise, there is no evidence of mediastinal, hilar or axillary adenopathy.   There is
no lobar consolidation, effusion or pneumothorax. There is calcified granuloma in the left lower
lobe. There is no dominant pulmonary mass. The thoracic vertebral body heights are grossly
maintained with minimal endplate Schmorl's defects. There is diffuse flowing anterior osteophytosis.
There is no significant spondylolisthesis or displaced fracture. The partially visualized upper
abdominal viscera are grossly within normal limits.

IMPRESSION

1.  Demonstration and partial visualization of a 5.0 x 4.3 cm mass centered in the left
supraclavicular fossa for which further evaluation with dedicated CT of the cervical soft tissues is
recommended.

2. No acute cardiopulmonary abnormality.

Tech Notes:

PT STATES HAS LEFT SIDE SUPRACLAVICULAR SWELLING. BILATERAL RIB CAGE TENDERNESS.  HX OF PROSTATE
CANCER. GFR 94. 9YY4B 06SYT77. CT/NM 0/0. TJ

## 2020-05-20 ENCOUNTER — Encounter: Admit: 2020-05-20 | Discharge: 2020-05-20 | Payer: MEDICARE

## 2020-06-23 ENCOUNTER — Encounter: Admit: 2020-06-23 | Discharge: 2020-06-24 | Payer: MEDICARE

## 2020-06-23 DIAGNOSIS — Z20822 Encounter for screening laboratory testing for COVID-19 virus in asymptomatic patient: Principal | ICD-10-CM

## 2020-06-23 LAB — COVID-19 (SARS-COV-2) PCR

## 2020-06-25 ENCOUNTER — Encounter: Admit: 2020-06-25 | Discharge: 2020-06-25 | Payer: MEDICARE

## 2020-06-26 ENCOUNTER — Encounter: Admit: 2020-06-26 | Discharge: 2020-06-26 | Payer: MEDICARE

## 2020-06-26 DIAGNOSIS — C8311 Mantle cell lymphoma, lymph nodes of head, face, and neck: Secondary | ICD-10-CM

## 2020-06-26 DIAGNOSIS — I10 Essential (primary) hypertension: Secondary | ICD-10-CM

## 2020-06-26 DIAGNOSIS — C61 Malignant neoplasm of prostate: Secondary | ICD-10-CM

## 2020-06-26 DIAGNOSIS — E785 Hyperlipidemia, unspecified: Secondary | ICD-10-CM

## 2020-06-26 LAB — COVID-19 SPIKE RBD, AB

## 2020-06-26 MED ORDER — DIPHENHYDRAMINE HCL 25 MG PO CAP
25 mg | Freq: Once | ORAL | 0 refills | Status: CP
Start: 2020-06-26 — End: ?
  Administered 2020-06-26: 17:00:00 25 mg via ORAL

## 2020-06-26 MED ORDER — ACETAMINOPHEN 325 MG PO TAB
650 mg | Freq: Once | ORAL | 0 refills | Status: CP
Start: 2020-06-26 — End: ?
  Administered 2020-06-26: 17:00:00 650 mg via ORAL

## 2020-06-26 MED ORDER — RITUXIMAB-PVVR IVPB (SPEC VOL)
375 mg/m2 | Freq: Once | INTRAVENOUS | 0 refills | Status: CP
Start: 2020-06-26 — End: ?
  Administered 2020-06-26 (×3): 850 mg via INTRAVENOUS

## 2020-06-26 MED ORDER — DEXAMETHASONE 4 MG PO TAB
2 mg | Freq: Once | ORAL | 0 refills | Status: CP
Start: 2020-06-26 — End: ?
  Administered 2020-06-26: 17:00:00 2 mg via ORAL

## 2020-06-26 MED ORDER — HEPARIN, PORCINE (PF) 100 UNIT/ML IV SYRG
500 [IU] | Freq: Once | 0 refills | Status: CP
Start: 2020-06-26 — End: ?

## 2020-06-26 NOTE — Progress Notes
Name: Robert Williams          MRN: [id number]      DOB: [birth year]      AGE: 75 y.o.   DATE OF SERVICE: 06/26/2020    Subjective:             Reason for Visit:  Follow Up      Robert Williams is a 75 y.o. male.     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 Mikey Bussing with mantle cell lymphoma.  He completed 6 cycles of of bendamustine and Rituxan.  He obtained a CR. He is here for maintenance Rituxan.  He started maintenance Rituxan in April 2021.     He is doing well with no new complaints. 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  No HEENT, cardiovascular, pulmonary, dermatology, GU or GI complaints.      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  Presented August 2020 with worsening left supraclavicular adenopathy.  Initial chest x-ray imaging showed pathologically enlarged nodes.  2.  01/13/2019 CT scans of the neck and chest showed bilateral cervical and supraclavicular adenopathy.  3.  01/17/2019 FNA of supraclavicular node revealed CD5-positive monoclonal B-cell population consistent with mantle cell lymphoma.  FISH for t(11;14) was positive, confirming the diagnosis.  4.  02/07/2019 PET/CT revealed hypermetabolic adenopathy above and below the diaphragm with no extranodal sites of disease.  Staging bone marrow biopsy was negative.  5.  Sept 2020 - March 2021 BR x 6 with CR.  6.  Rituximab maintenance     He is alone.  He is married.     Review of Systems        Objective:         ? acyclovir (ZOVIRAX) 800 mg tablet Take one tablet by mouth every 12 hours.   ? aspirin 325 mg tablet Take 1 Tab by mouth daily.   ? losartan-hydrochlorothiazide (HYZAAR) 50-12.5 mg tablet Take 1 tablet by mouth daily.   ? ondansetron (ZOFRAN) 8 mg tablet Take 1 tablet by mouth every 8 hours as needed for nasuea/vomitting.   ? trimethoprim/sulfamethoxazole (BACTRIM) 80/400 mg tablet Take one tablet by mouth daily.     Vitals:    06/26/20 0948   BP: 128/64   BP Source: Arm, Left Upper   Patient Position: Sitting   Pulse: 90   Resp: 18   Temp: 36.5 ?C (97.7 ?F)   TempSrc: Oral   SpO2: 99%   Weight: 107 kg (236 lb)   Height: 182.9 cm (72.01)   PainSc: Zero     Body mass index is 32 kg/m?[name]     Pain Score: Zero         Pain Addressed:  N/A  [place] Cooperative Oncology Group performance status is 0, Fully active, able to carry on all pre-disease performance without restriction.[name]     Physical Exam  Vitals reviewed.   Constitutional:       Appearance: He is well-developed.   HENT:      Head: Normocephalic.   Cardiovascular:      Rate and Rhythm: Normal rate and regular rhythm.  Pulmonary:      Effort: Pulmonary effort is normal.      Breath sounds: Normal breath sounds.   Abdominal:      Palpations: Abdomen is soft.   Musculoskeletal:         General: Normal range of motion.      Cervical back: Normal range of motion.   Lymphadenopathy:      Cervical: No cervical adenopathy.      Upper Body:      Right upper body: No supraclavicular or axillary adenopathy.      Left upper body: No supraclavicular or axillary adenopathy.   Skin:     General: Skin is warm and dry.   Neurological:      Mental Status: He is alert and oriented to person, place, and time.            CBC w/Diff    Lab Results   Component Value Date/Time    WBC 6.3 06/26/2020 09:37 AM    RBC 4.62 06/26/2020 09:37 AM    HGB 14.5 06/26/2020 09:37 AM    HCT 43.8 06/26/2020 09:37 AM    MCV 94.6 06/26/2020 09:37 AM    MCH 31.2 06/26/2020 09:37 AM    MCHC 33.0 06/26/2020 09:37 AM    RDW 13.5 06/26/2020 09:37 AM    PLTCT 253 06/26/2020 09:37 AM    MPV 7.7 06/26/2020 09:37 AM    Lab Results   Component Value Date/Time    NEUT 80 (H) 06/26/2020 09:37 AM    ANC 5.00 06/26/2020 09:37 AM LYMA 8 (L) 06/26/2020 09:37 AM    ALC 0.50 (L) 06/26/2020 09:37 AM    MONA 10 06/26/2020 09:37 AM    AMC 0.60 06/26/2020 09:37 AM    EOSA 1 06/26/2020 09:37 AM    AEC 0.10 06/26/2020 09:37 AM    BASA 1 06/26/2020 09:37 AM    ABC 0.10 06/26/2020 09:37 AM        Comprehensive Metabolic Profile    Lab Results   Component Value Date/Time    NA 141 04/24/2020 09:35 AM    K 4.3 04/24/2020 09:35 AM    CL 104 04/24/2020 09:35 AM    CO2 27 04/24/2020 09:35 AM    GAP 10 04/24/2020 09:35 AM    BUN 18 04/24/2020 09:35 AM    CR 0.91 04/24/2020 09:35 AM    GLU 96 04/24/2020 09:35 AM    Lab Results   Component Value Date/Time    CA 9.2 04/24/2020 09:35 AM    PO4 2.8 01/27/2019 09:14 AM    ALBUMIN 4.3 04/24/2020 09:35 AM    TOTPROT 6.5 04/24/2020 09:35 AM    ALKPHOS 69 04/24/2020 09:35 AM    AST 16 04/24/2020 09:35 AM    ALT 24 04/24/2020 09:35 AM    TOTBILI 0.6 04/24/2020 09:35 AM    GFR >60 04/24/2020 09:35 AM    GFRAA >60 04/24/2020 09:35 AM               Assessment and Plan:    1. Stage III mantle cell lymphoma.  He completed 6 cycles of BR and obtained CR.   He started maintenance Rituxan April 2021 is receiving it every 2 months.  He will receive it today as scheduled.  He will follow-up with Dr. Mikey Bussing as scheduled.  He is doing well and has no residual side effects from treatment.  He remains active.  2. He has received both COVID-19 vaccines.  He received  his booster vaccine on 01/09/2020.  3. 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 He will continue acyclovir 800 mg twice daily.  5. He will continue Bactrim single strength daily.  6. His last CD4 count in September 2021 was 158.    Lab pending today.  7. He received Prevnar in 2017.  He received his COVID booster in August 2021.  Spike antibody drawn today to determine response to COVID vaccines.  8. He will continue to follow with his primary doctor, Dr. Herschell Dimes.  Lipid profile was drawn today and shows improvement. He takes a small dose of atorvastatin.  9. He will continue to follow-up with Dr. Herschell Dimes, his primary doctor as needed.

## 2020-06-26 NOTE — Progress Notes
Cycle 1 Day 281 Rituximab    Labs drawn from port.  Patient seen by Morey Hummingbird, NP for full eval, OK to treat.    Rituximab given per rapid protocol and tolerated without incident.    Discharged in good condition, ambulatory.    CHEMO NOTE    Labs/applicable tests checked: CBC, CMP  Verified chemo consent signed and in chart.  BSA and dose double checked (agree with orders as written).    Arm band verified at bedside with second RN.  Premedications/Prehydration given as ordered.  Chemo drug/dose/route: see MAR  Rate verified with second RN.    Patient education offered and stated understanding.

## 2020-06-26 NOTE — Patient Instructions
Call Immediately to report the following:  Uncontrolled nausea and/or vomiting, uncontrolled pain, or unusual bleeding.  Temperature of 100.4 F or greater and/or any sign/symptom of infection (redness, warmth, tenderness)  Painful mouth or difficulty swallowing  Red, cracked, or painful hands and/or feet  Diarrhea   Swelling of arms or legs  Rash    Important Phone Numbers:  [facility] Main Number (answered 24 hours a day) [phone]  Cancer Center Scheduling (appointments) [phone] OR [phone]  [facility] (for nutritional supplements) [phone]        Port Maintenance - If you have a port, it should be flushed every 6-8 weeks when not in use.  Please check with your MD, nurse, or the scheduler.

## 2020-08-09 ENCOUNTER — Encounter: Admit: 2020-08-09 | Discharge: 2020-08-09 | Payer: MEDICARE

## 2020-08-09 DIAGNOSIS — Z20822 Encounter for screening laboratory testing for COVID-19 virus in asymptomatic patient: Secondary | ICD-10-CM

## 2020-08-11 ENCOUNTER — Encounter: Admit: 2020-08-11 | Discharge: 2020-08-12 | Payer: MEDICARE

## 2020-08-11 DIAGNOSIS — Z20822 Encounter for screening laboratory testing for COVID-19 virus in asymptomatic patient: Secondary | ICD-10-CM

## 2020-08-12 ENCOUNTER — Encounter: Admit: 2020-08-12 | Discharge: 2020-08-12 | Payer: MEDICARE

## 2020-08-12 DIAGNOSIS — C8311 Mantle cell lymphoma, lymph nodes of head, face, and neck: Secondary | ICD-10-CM

## 2020-08-12 LAB — IMMUNOGLOBULINS-IGA,IGG,IGM
Lab: 157 mg/dL (ref 70–390)
Lab: 660 mg/dL — ABNORMAL LOW (ref 762–1488)
Lab: <20 mg/dL — ABNORMAL LOW (ref 38–328)

## 2020-08-12 LAB — POC CREATININE, RAD: Lab: 0.9 mg/dL (ref 0.4–1.24)

## 2020-08-12 MED ORDER — IOHEXOL 350 MG IODINE/ML IV SOLN
100 mL | Freq: Once | INTRAVENOUS | 0 refills | Status: CP
Start: 2020-08-12 — End: ?

## 2020-08-12 MED ORDER — SODIUM CHLORIDE 0.9 % IJ SOLN
50 mL | Freq: Once | INTRAVENOUS | 0 refills | Status: CP
Start: 2020-08-12 — End: ?

## 2020-08-12 MED ORDER — HEPARIN, PORCINE (PF) 100 UNIT/ML IV SYRG
500 [IU] | Freq: Once | 0 refills | Status: CP
Start: 2020-08-12 — End: ?

## 2020-08-14 ENCOUNTER — Encounter: Admit: 2020-08-14 | Discharge: 2020-08-14 | Payer: MEDICARE

## 2020-08-14 DIAGNOSIS — I10 Essential (primary) hypertension: Secondary | ICD-10-CM

## 2020-08-14 DIAGNOSIS — C61 Malignant neoplasm of prostate: Secondary | ICD-10-CM

## 2020-08-14 DIAGNOSIS — C8311 Mantle cell lymphoma, lymph nodes of head, face, and neck: Secondary | ICD-10-CM

## 2020-08-14 DIAGNOSIS — E785 Hyperlipidemia, unspecified: Secondary | ICD-10-CM

## 2020-08-14 MED ORDER — DIPHENHYDRAMINE HCL 25 MG PO CAP
25 mg | Freq: Once | ORAL | 0 refills | Status: DC
Start: 2020-08-14 — End: 2020-08-14

## 2020-08-14 MED ORDER — ACETAMINOPHEN 325 MG PO TAB
650 mg | Freq: Once | ORAL | 0 refills
Start: 2020-08-14 — End: ?

## 2020-08-14 MED ORDER — RITUXIMAB-PVVR IVPB (SPEC VOL)
375 mg/m2 | Freq: Once | INTRAVENOUS | 0 refills | Status: DC
Start: 2020-08-14 — End: 2020-08-14

## 2020-08-14 MED ORDER — DEXAMETHASONE 4 MG PO TAB
2 mg | Freq: Once | ORAL | 0 refills
Start: 2020-08-14 — End: ?

## 2020-08-14 MED ORDER — RITUXIMAB-PVVR IVPB (SPEC VOL)
375 mg/m2 | Freq: Once | INTRAVENOUS | 0 refills
Start: 2020-08-14 — End: ?

## 2020-08-14 MED ORDER — DEXAMETHASONE 4 MG PO TAB
2 mg | Freq: Once | ORAL | 0 refills | Status: DC
Start: 2020-08-14 — End: 2020-08-14

## 2020-08-14 MED ORDER — DIPHENHYDRAMINE HCL 25 MG PO CAP
25 mg | Freq: Once | ORAL | 0 refills
Start: 2020-08-14 — End: ?

## 2020-08-14 MED ORDER — ACETAMINOPHEN 325 MG PO TAB
650 mg | Freq: Once | ORAL | 0 refills | Status: DC
Start: 2020-08-14 — End: 2020-08-14

## 2020-08-14 NOTE — Progress Notes
Patient seen by Morey Hummingbird, NP - OK to treat, however it is 1 week early and will not be covered by insurance so he will be deferred 1 week.    Discharged in good condition, ambulatory.

## 2020-08-21 ENCOUNTER — Encounter: Admit: 2020-08-21 | Discharge: 2020-08-21 | Payer: MEDICARE

## 2020-08-21 DIAGNOSIS — I10 Essential (primary) hypertension: Secondary | ICD-10-CM

## 2020-08-21 DIAGNOSIS — C8311 Mantle cell lymphoma, lymph nodes of head, face, and neck: Secondary | ICD-10-CM

## 2020-08-21 DIAGNOSIS — E785 Hyperlipidemia, unspecified: Secondary | ICD-10-CM

## 2020-08-21 DIAGNOSIS — C61 Malignant neoplasm of prostate: Secondary | ICD-10-CM

## 2020-08-21 MED ORDER — HEPARIN, PORCINE (PF) 100 UNIT/ML IV SYRG
500 [IU] | Freq: Once | 0 refills | Status: CP
Start: 2020-08-21 — End: ?

## 2020-08-21 MED ORDER — RITUXIMAB-PVVR IVPB (SPEC VOL)
375 mg/m2 | Freq: Once | INTRAVENOUS | 0 refills | Status: CP
Start: 2020-08-21 — End: ?
  Administered 2020-08-21 (×3): 850 mg via INTRAVENOUS

## 2020-08-21 MED ORDER — ACETAMINOPHEN 325 MG PO TAB
650 mg | Freq: Once | ORAL | 0 refills | Status: CP
Start: 2020-08-21 — End: ?
  Administered 2020-08-21: 17:00:00 650 mg via ORAL

## 2020-08-21 MED ORDER — DIPHENHYDRAMINE HCL 25 MG PO CAP
25 mg | Freq: Once | ORAL | 0 refills | Status: CP
Start: 2020-08-21 — End: ?
  Administered 2020-08-21: 17:00:00 25 mg via ORAL

## 2020-08-21 MED ORDER — DEXAMETHASONE 4 MG PO TAB
2 mg | Freq: Once | ORAL | 0 refills | Status: CP
Start: 2020-08-21 — End: ?
  Administered 2020-08-21: 17:00:00 2 mg via ORAL

## 2020-08-21 NOTE — Patient Instructions
Call Immediately to report the following:  Uncontrolled nausea and/or vomiting, uncontrolled pain, or unusual bleeding.  Temperature of 100.4 F or greater and/or any sign/symptom of infection (redness, warmth, tenderness)  Painful mouth or difficulty swallowing  Red, cracked, or painful hands and/or feet  Diarrhea   Swelling of arms or legs  Rash    Important Phone Numbers:  [facility] Main Number (answered 24 hours a day) [phone]  Cancer Center Scheduling (appointments) [phone] OR [phone]  [facility] (for nutritional supplements) [phone]        Port Maintenance - If you have a port, it should be flushed every 6-8 weeks when not in use.  Please check with your MD, nurse, or the scheduler.

## 2020-08-21 NOTE — Progress Notes
Cycle 2 Day 1 Rituxan.  Port accessed, blood returned.  Tolerated infusion well.   Port de-accessed per protocol, heparinized.   Discharged in stable condition.     CHEMO NOTE  Verified chemo consent signed and in chart.    Blood return positive via: Port (Single)    BSA and dose double checked (agree with orders as written) with: yes     Labs/applicable tests checked: None    Chemo regimen: Drug/cycle/day Rituxan C2 D1     Rate verified and armband double check with second RN: yes see MAR    Patient education offered and stated understanding. Denies questions at this time.

## 2020-09-17 ENCOUNTER — Encounter: Admit: 2020-09-17 | Discharge: 2020-09-17 | Payer: MEDICARE

## 2020-10-07 ENCOUNTER — Encounter: Admit: 2020-10-07 | Discharge: 2020-10-07 | Payer: MEDICARE

## 2020-10-10 ENCOUNTER — Encounter: Admit: 2020-10-10 | Discharge: 2020-10-10 | Payer: MEDICARE

## 2020-10-13 ENCOUNTER — Encounter: Admit: 2020-10-13 | Discharge: 2020-10-13 | Payer: MEDICARE

## 2020-10-15 ENCOUNTER — Encounter: Admit: 2020-10-15 | Discharge: 2020-10-15 | Payer: MEDICARE

## 2020-10-16 ENCOUNTER — Encounter: Admit: 2020-10-16 | Discharge: 2020-10-16 | Payer: MEDICARE

## 2020-10-16 DIAGNOSIS — C8311 Mantle cell lymphoma, lymph nodes of head, face, and neck: Secondary | ICD-10-CM

## 2020-10-16 DIAGNOSIS — I10 Essential (primary) hypertension: Secondary | ICD-10-CM

## 2020-10-16 DIAGNOSIS — Z20822 Encounter for screening laboratory testing for COVID-19 virus in asymptomatic patient: Secondary | ICD-10-CM

## 2020-10-16 DIAGNOSIS — E785 Hyperlipidemia, unspecified: Secondary | ICD-10-CM

## 2020-10-16 DIAGNOSIS — C61 Malignant neoplasm of prostate: Secondary | ICD-10-CM

## 2020-10-16 MED ORDER — HEPARIN, PORCINE (PF) 100 UNIT/ML IV SYRG
500 [IU] | Freq: Once | 0 refills | Status: CP
Start: 2020-10-16 — End: ?

## 2020-10-16 MED ORDER — ACETAMINOPHEN 325 MG PO TAB
650 mg | Freq: Once | ORAL | 0 refills | Status: CP
Start: 2020-10-16 — End: ?
  Administered 2020-10-16: 14:00:00 650 mg via ORAL

## 2020-10-16 MED ORDER — DEXAMETHASONE 4 MG PO TAB
2 mg | Freq: Once | ORAL | 0 refills | Status: CP
Start: 2020-10-16 — End: ?
  Administered 2020-10-16: 14:00:00 2 mg via ORAL

## 2020-10-16 MED ORDER — TIXAGEVIMAB-CILGAVIMAB 150 MG/1.5 ML- 150 MG/1.5 ML IM SOLN
600 mg | Freq: Once | INTRAMUSCULAR | 0 refills
Start: 2020-10-16 — End: ?

## 2020-10-16 MED ORDER — RITUXIMAB-PVVR IVPB (SPEC VOL)
375 mg/m2 | Freq: Once | INTRAVENOUS | 0 refills | Status: CP
Start: 2020-10-16 — End: ?
  Administered 2020-10-16 (×3): 850 mg via INTRAVENOUS

## 2020-10-16 MED ORDER — TIXAGEVIMAB-CILGAVIMAB 150 MG/1.5 ML- 150 MG/1.5 ML IM SOLN
600 mg | Freq: Once | INTRAMUSCULAR | 0 refills | Status: CP
Start: 2020-10-16 — End: ?
  Administered 2020-10-16 (×2): 600 mg via INTRAMUSCULAR

## 2020-10-16 MED ORDER — TIXAGEVIMAB-CILGAVIMAB 150 MG/1.5 ML- 150 MG/1.5 ML IM SOLN
600 mg | Freq: Once | INTRAMUSCULAR | 0 refills | Status: CN
Start: 2020-10-16 — End: ?

## 2020-10-16 MED ORDER — DIPHENHYDRAMINE HCL 25 MG PO CAP
25 mg | Freq: Once | ORAL | 0 refills | Status: CP
Start: 2020-10-16 — End: ?
  Administered 2020-10-16: 14:00:00 25 mg via ORAL

## 2020-10-16 NOTE — Assessment & Plan Note
Impression:  1. Stage III mantle cell lymphoma  2. 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 Hypertension  4. Hyperlipidemia  5. ECOG PS 0    Plan:  Doing well.  Tolerating therapy well without issues.  He has no evidence of clinical disease progression and had obvious disease on physical exam at presentation. Also, reviewed most recent CT imaging from 07/2020 that showed no evidence of disease.  Continue rituximab 375 mg/m? IV every 2 months with next treatment = 10/16/20.  Optimal duration of maintenance has not yet been established.  It was given indefinitely in the R-CHOP trial without transplant and for 3 years in the post transplant study.  I am recommending that we give him at least 2 years and monitor his quantitative immunoglobulin and CD4 count levels to determine the degree of immune suppression he is developing and utilize that as a barometer to determine whether an additional year to 3 years is necessary.  Monitor immunoglobulins and CD4 count every 6 months  Continue acyclovir 800 mg p.o. twice daily  Continue Bactrim SS daily.  Will plan to repeat imaging for any new or concerning symptoms.  Due to his history of lymphoma, he has an elevated risk of infections and secondary malignancies.  He additionally should have aggressive age-appropriate cancer screening.  We will investigate his vaccination history and initiate any indicated pneumococcal vaccinations or shingles vaccines after he has some immune reconstitution after BR.  He has received four doses of the coronavirus vaccine. However, discussed with him that he was not seen to produce significant COVID antibody response when tested in 06/2020. Therefore, to provide additional humoral protection against COVID-19, would recommend that he receive Evusheld treatment. He agreed with this plan and will receive this treatment today.   RTC with [name] in 2 months, with Dr. Paulene Floor in 4 months, or sooner should new or concerning symptoms develop.    I have reviewed the diagnostic and treatment plan in detail with the patient and he is in agreement with the above approach. Answered all questions that he had to his satisfaction.    Patient seen and plan of care fully discussed with Dr. Paulene Floor.    Verita Lamb, MD  Hematology/Oncology Fellow  [id number]  Pager: [phone]

## 2020-10-16 NOTE — Progress Notes
Pt in tx for port access/labs and scheduled chemotherapy. Port accessed without difficulties. Labs drawn per order.   Lab results reviewed, pt seen by Dr. Daisy Blossom, ok to treat. Pt denies any concerns or questions at this time. Voiced understanding of the treatment regimen.     Premeds given per plan.     CHEMO NOTE  Verified chemo consent signed and in chart.    Verified initiate chemo order in O2    Blood return positive via: Port (single)    BSA and dose double checked (agree with orders as written): Yes    Labs/applicable tests checked: cbc, cmp    Chemo regime: Ruxience cycle 2 day 57    Rate verified and armband double checkwith second RN: see EMAR    Patient education offered and stated understanding. Denies questions at this time.    Pt also received Evusheld IM injection 600 mg on left and right glute muscles. Pt tolerated well, pt observed for 1 hr, no s/s of adverse reactions noted.    Port flushed, heparinized and de-accessed. Pt tolerated the infusion well, left clinic in good condition.

## 2020-12-02 ENCOUNTER — Encounter: Admit: 2020-12-02 | Discharge: 2020-12-02 | Payer: MEDICARE

## 2020-12-10 NOTE — Progress Notes
Name: Robert Williams          MRN: [id number]      DOB: 08/26/1945      AGE: 75 y.o.   DATE OF SERVICE: 12/11/2020    Subjective:             Reason for Visit:  Cancer Follow up      Robert Williams is a 75 y.o. male.     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 Modena Slater with the following oncology history:    1. He presented August 2020 with worsening left supraclavicular adenopathy.  Initial chest x-ray imaging showed pathologically enlarged nodes.  2.  01/13/2019 CT scans of the neck and chest showed bilateral cervical and supraclavicular adenopathy.  3.  01/17/2019 FNA of supraclavicular node revealed CD5-positive monoclonal B-cell population consistent with mantle cell lymphoma.  FISH for t(11;14) was positive, confirming the diagnosis.  4.  02/07/2019 PET/CT revealed hypermetabolic adenopathy above and below the diaphragm with no extranodal sites of disease.  Staging bone marrow biopsy was negative.  5.  Sept 2020 - March 2021 BR x 6 with CR.  6.  Rituximab maintenance    Interval Events:  [name] presents to clinic prior to receiving his next cycle of treatment. He is alone. He is doing well. He is bothered by the right inguinal hernia and is following with a surgeon on 12/13/20 for possible intervention. The hernia is bothersome occasionally and notes specifically about three times where he has had acute, severe, though self-limiting pain. It is reducible. The pain is not constant. He is eating and drinking well. Not able to be as active as he used to be due to the hernia which is aggravated by activity. He denies fevers, chills, weight loss,  drenching night sweats or new adenopathy.  ?     Review of Systems   Constitutional: Negative for chills, diaphoresis, fatigue and fever.   Respiratory: Negative for cough and shortness of breath.    Cardiovascular: Negative for chest pain.   Gastrointestinal: Negative for nausea and vomiting.   Musculoskeletal:        Occasional right groin pain from the inguinal hernia.   Hematological: Negative for adenopathy.         Objective:         ? acyclovir (ZOVIRAX) 800 mg tablet Take one tablet by mouth every 12 hours.   ? aspirin 325 mg tablet Take 1 Tab by mouth daily.   ? losartan-hydrochlorothiazide (HYZAAR) 50-12.5 mg tablet Take 1 tablet by mouth daily.   ? ondansetron (ZOFRAN) 8 mg tablet Take 1 tablet by mouth every 8 hours as needed for nasuea/vomitting.   ? trimethoprim/sulfamethoxazole (BACTRIM) 80/400 mg tablet Take one tablet by mouth daily.     Vitals:    12/11/20 0917 12/11/20 0922   BP: 114/59    BP Source: Arm, Left Upper Arm, Left Upper   Pulse: 76    Temp: 36.6 ?C (97.8 ?F)    Resp: 18    SpO2: 98%    O2 Device:  None (Room air)   TempSrc: Oral    PainSc: Zero    Weight: 110 kg (242  lb 6.4 oz)    Height: 183 cm (6' 0.05)      Body mass index is 32.83 kg/m?[name]     Pain Score: Zero       Fatigue Scale: 0-None    Pain Addressed:  N/A    Patient Evaluated for a Clinical Trial: No treatment clinical trial available for this patient.     [place] Cooperative Oncology Group performance status is 0, Fully active, able to carry on all pre-disease performance without restriction.[name]     Physical Exam  Constitutional:       Appearance: Normal appearance.   HENT:      Head: Normocephalic and atraumatic.   Eyes:      General: No scleral icterus.  Cardiovascular:      Rate and Rhythm: Normal rate and regular rhythm.      Heart sounds: Normal heart sounds.   Pulmonary:      Effort: Pulmonary effort is normal.      Breath sounds: Normal breath sounds.   Chest:   Breasts:      Right: No supraclavicular adenopathy.      Left: No axillary adenopathy or supraclavicular adenopathy.       Abdominal: General: Bowel sounds are normal.      Palpations: Abdomen is soft.   Musculoskeletal:      Right lower leg: No edema.      Left lower leg: No edema.   Lymphadenopathy:      Head:      Right side of head: No submandibular adenopathy.      Left side of head: No submandibular adenopathy.      Cervical: No cervical adenopathy.      Upper Body:      Right upper body: No supraclavicular adenopathy.      Left upper body: No supraclavicular or axillary adenopathy.   Skin:     General: Skin is warm and dry.   Neurological:      Mental Status: He is alert and oriented to person, place, and time.          CBC w diff  CBC with Diff Latest Ref Rng & Units 12/11/2020 10/16/2020 08/12/2020 06/26/2020 04/24/2020   WBC 4.5 - 11.0 K/UL 6.6 6.7 5.3 6.3 5.7   RBC 4.4 - 5.5 M/UL 4.70 4.59 4.54 4.62 4.65   HGB 13.5 - 16.5 GM/DL 16.1 09.6 04.5 40.9 81.1   HCT 40 - 50 % 43.5 42.9 43.1 43.8 44.0   MCV 80 - 100 FL 92.5 93.5 94.9 94.6 94.7   MCH 26 - 34 PG 31.2 31.2 31.1 31.2 31.7   MCHC 32.0 - 36.0 G/DL 91.4 78.2 95.6 21.3 08.6   RDW 11 - 15 % 13.9 13.5 13.3 13.5 13.4   PLT 150 - 400 K/UL 249 239 239 253 249   MPV 7 - 11 FL 8.0 8.0 7.9 7.7 7.9   NEUT 41 - 77 % 76 76 74 80(H) 79(H)   ANC 1.8 - 7.0 K/UL 5.00 5.20 3.90 5.00 4.50   LYMA 24 - 44 % 11(L) 10(L) 12(L) 8(L) 9(L)   ALYM 1.0 - 4.8 K/UL 0.70(L) 0.60(L) 0.70(L) 0.50(L) 0.50(L)   MONA 4 - 12 % 11 12 12 10 10    AMONO 0 - 0.80 K/UL 0.70 0.80 0.60 0.60 0.50   EOSA 0 - 5 % 1 1 1 1 1    AEOS 0 - 0.45 K/UL 0.10 0.10 0.10 0.10 0.00   BASA 0 - 2 %  1 1 1 1 1    ABAS 0 - 0.20 K/UL 0.10 0.00 0.00 0.10 0.00     Comprehensive Metabolic Profile  CMP Latest Ref Rng & Units 10/16/2020 08/12/2020 06/26/2020 04/24/2020 02/21/2020   NA 137 - 147 MMOL/L 140 139 142 141 139   K 3.5 - 5.1 MMOL/L 4.2 3.9 4.0 4.3 4.2   CL 98 - 110 MMOL/L 104 101 105 104 103   CO2 21 - 30 MMOL/L 28 26 26 27 27    GAP 3 - 12 8 12 11 10 9    BUN 7 - 25 MG/DL 14 15 19 18 16    CR 0.4 - 1.24 MG/DL 4.54 0.98 1.19 1.47 8.29   GLUX 70 - 100 MG/DL 562(Z) 95 308(M) 96 578(I)   CA 8.5 - 10.6 MG/DL 9.4 9.3 9.2 9.2 9.7   TP 6.0 - 8.0 G/DL 6.2 6.5 6.7 6.5 6.7   ALB 3.5 - 5.0 G/DL 4.3 4.4 4.5 4.3 4.3   ALKP 25 - 110 U/L 71 65 67 69 68   ALT 7 - 56 U/L 20 21 22 24 23    TBILI 0.3 - 1.2 MG/DL 0.4 0.7 0.5 0.6 0.6   GFR >60 mL/min - - - >60 >60   GFRAA >60 mL/min - - - >60 >60     CMP pending 12/11/20.          Assessment and Plan:    Stage III mantle cell lymphoma diagnosed in August 2020. He received BR x 6 with complete remission. He has been on rituximab maintenance.    Proceed with rituxan 375 mg/m2 on 12/11/20.     We will monitor immunoglobulin and CD 4 count every 6 months, next due in September 2022.    He will continue Bactrim single strength daily and acyclovir 800 mg po BID.    Plan for follow up imaging for new or concerning symptoms.    He received Evusheld on 10/16/20.    Hematologic: CBC on 12/11/20 showed WBC, hemoglobin and platelets within normal range.    Right inguinal hernia: He will follow up with his surgeon on 12/13/20 for evaluation and recommendations.    Health maintenance: He will continue to receive age-appropriate cancer screening.    RTC: 2 months for MD visit, labs and treatment. Call sooner for questions or concerns.

## 2020-12-11 ENCOUNTER — Encounter: Admit: 2020-12-11 | Discharge: 2020-12-11 | Payer: MEDICARE

## 2020-12-11 DIAGNOSIS — C8311 Mantle cell lymphoma, lymph nodes of head, face, and neck: Secondary | ICD-10-CM

## 2020-12-11 DIAGNOSIS — C61 Malignant neoplasm of prostate: Secondary | ICD-10-CM

## 2020-12-11 DIAGNOSIS — E785 Hyperlipidemia, unspecified: Secondary | ICD-10-CM

## 2020-12-11 DIAGNOSIS — I10 Essential (primary) hypertension: Secondary | ICD-10-CM

## 2020-12-11 LAB — IMMUNOGLOBULINS-IGA,IGG,IGM
IGA: 137 mg/dL (ref 70–390)
IGG: 634 mg/dL — ABNORMAL LOW (ref 762–1488)
IGM: <20 mg/dL — ABNORMAL LOW (ref 38–328)

## 2020-12-11 MED ORDER — HEPARIN, PORCINE (PF) 100 UNIT/ML IV SYRG
500 [IU] | Freq: Once | 0 refills | Status: CP
Start: 2020-12-11 — End: ?

## 2020-12-11 MED ORDER — RITUXIMAB-PVVR IVPB (SPEC VOL)
375 mg/m2 | Freq: Once | INTRAVENOUS | 0 refills | Status: CP
Start: 2020-12-11 — End: ?
  Administered 2020-12-11 (×3): 850 mg via INTRAVENOUS

## 2020-12-11 MED ORDER — ACETAMINOPHEN 325 MG PO TAB
650 mg | Freq: Once | ORAL | 0 refills | Status: CP
Start: 2020-12-11 — End: ?
  Administered 2020-12-11: 15:00:00 650 mg via ORAL

## 2020-12-11 MED ORDER — DIPHENHYDRAMINE HCL 25 MG PO CAP
25 mg | Freq: Once | ORAL | 0 refills | Status: CP
Start: 2020-12-11 — End: ?
  Administered 2020-12-11: 15:00:00 25 mg via ORAL

## 2020-12-11 MED ORDER — DEXAMETHASONE 4 MG PO TAB
2 mg | Freq: Once | ORAL | 0 refills | Status: CP
Start: 2020-12-11 — End: ?
  Administered 2020-12-11: 15:00:00 2 mg via ORAL

## 2020-12-11 NOTE — Progress Notes
Pt in tx for port access/labs and scheduled chemotherapy. Port accessed without difficulties. Labs drawn per order.   Lab results reviewed, pt seen by C. Schmidt APP today, Pt denies any concerns or questions at this time. Voiced understanding of the treatment regimen.     Premeds given per plan.     CHEMO NOTE  Verified chemo consent signed and in chart.    Verified initiate chemo order in O2    Blood return positive via: Port (single)    BSA and dose double checked (agree with orders as written): Yes    Labs/applicable tests checked: cbc, cmp    Chemo regime: Ruxience cycle 2 day 113    Rate verified and armband double checkwith second RN: see EMAR    Patient education offered and stated understanding. Denies questions at this time.    Port flushed, heparinized and de-accessed. Pt tolerated the infusion well, left clinic in good condition.

## 2020-12-13 ENCOUNTER — Encounter: Admit: 2020-12-13 | Discharge: 2020-12-13 | Payer: MEDICARE

## 2020-12-23 ENCOUNTER — Encounter: Admit: 2020-12-23 | Discharge: 2020-12-23 | Payer: MEDICARE

## 2021-01-27 ENCOUNTER — Encounter: Admit: 2021-01-27 | Discharge: 2021-01-27 | Payer: MEDICARE

## 2021-02-04 ENCOUNTER — Encounter: Admit: 2021-02-04 | Discharge: 2021-02-04 | Payer: MEDICARE

## 2021-02-05 ENCOUNTER — Encounter: Admit: 2021-02-05 | Discharge: 2021-02-05 | Payer: MEDICARE

## 2021-02-05 DIAGNOSIS — C8311 Mantle cell lymphoma, lymph nodes of head, face, and neck: Secondary | ICD-10-CM

## 2021-02-05 DIAGNOSIS — Z20822 Encounter for screening laboratory testing for COVID-19 virus in asymptomatic patient: Secondary | ICD-10-CM

## 2021-02-05 DIAGNOSIS — C61 Malignant neoplasm of prostate: Secondary | ICD-10-CM

## 2021-02-05 DIAGNOSIS — I10 Essential (primary) hypertension: Secondary | ICD-10-CM

## 2021-02-05 DIAGNOSIS — E785 Hyperlipidemia, unspecified: Secondary | ICD-10-CM

## 2021-02-05 MED ORDER — RITUXIMAB-PVVR IVPB (SPEC VOL)
375 mg/m2 | Freq: Once | INTRAVENOUS | 0 refills | Status: CP
Start: 2021-02-05 — End: ?
  Administered 2021-02-05 (×3): 850 mg via INTRAVENOUS

## 2021-02-05 MED ORDER — DIPHENHYDRAMINE HCL 25 MG PO CAP
25 mg | Freq: Once | ORAL | 0 refills | Status: CP
Start: 2021-02-05 — End: ?
  Administered 2021-02-05: 15:00:00 25 mg via ORAL

## 2021-02-05 MED ORDER — ACETAMINOPHEN 325 MG PO TAB
650 mg | Freq: Once | ORAL | 0 refills | Status: CP
Start: 2021-02-05 — End: ?
  Administered 2021-02-05: 15:00:00 650 mg via ORAL

## 2021-02-05 MED ORDER — HEPARIN, PORCINE (PF) 100 UNIT/ML IV SYRG
500 [IU] | Freq: Once | 0 refills | Status: CP
Start: 2021-02-05 — End: ?

## 2021-02-05 MED ORDER — DEXAMETHASONE 4 MG PO TAB
2 mg | Freq: Once | ORAL | 0 refills | Status: CP
Start: 2021-02-05 — End: ?
  Administered 2021-02-05: 15:00:00 2 mg via ORAL

## 2021-02-05 NOTE — Assessment & Plan Note
Impression:  1. Stage III mantle cell lymphoma  2. 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 Hypertension  4. Hyperlipidemia  5. ECOG PS 0    Plan:  Doing well.  Tolerating therapy well without issues.  He has no evidence of clinical disease progression and had obvious disease on physical exam at presentation.   Continue rituximab 375 mg/m? IV every 2 months with next treatment = 02/05/21.  Optimal duration of maintenance has not yet been established.  It was given indefinitely in the R-CHOP trial without transplant and for 3 years in the post transplant study.  I am recommending that we give him at least 2 years and monitor his quantitative immunoglobulin and CD4 count levels to determine the degree of immune suppression he is developing and utilize that as a barometer to determine whether an additional year to 3 years is necessary. CD4 count from today (02/05/21 is pending)  Monitor immunoglobulins and CD4 count every 6 months  Continue acyclovir 800 mg p.o. twice daily  Continue Bactrim SS daily.  Will plan to repeat imaging for any new or concerning symptoms.  Due to his history of lymphoma, he has an elevated risk of infections and secondary malignancies.  He additionally should have aggressive age-appropriate cancer screening.  Will plan to administer indicated pneumococcal vaccinations or shingles vaccines after he has some immune reconstitution after BR.  He has received four doses of the coronavirus vaccine. However, discussed with him that he was not seen to produce significant COVID antibody response when tested in 06/2020. Therefore, to provide additional humoral protection against COVID-19, would recommend that he receive Evusheld treatment. First dose 10/16/20. Will plan to give next dose in 03/2021 at the time of his next Rituximab dosing.  RTC with [name] in 2 months, with Dr. Paulene Floor in 4 months, or sooner should new or concerning symptoms develop.    I have reviewed the diagnostic and treatment plan in detail with the patient and he is in agreement with the above approach.

## 2021-02-05 NOTE — Progress Notes
CHEMO NOTE  Verified chemo consent signed and in chart.    Blood return positive via: Port (Single)    BSA and dose double checked (agree with orders as written) with: yes  see MAR    Labs/applicable tests checked: CBC and Comprehensive Metabolic Panel (CMP)    Chemo regimen: Drug/cycle/day  cycle 2, day 169 Rituxan    Rate verified and armband double check with second RN: yes    Patient education offered and stated understanding. Denies questions at this time.

## 2021-02-18 ENCOUNTER — Encounter: Admit: 2021-02-18 | Discharge: 2021-02-18 | Payer: MEDICARE

## 2021-02-18 MED ORDER — ACYCLOVIR 800 MG PO TAB
800 mg | ORAL_TABLET | Freq: Two times a day (BID) | ORAL | 3 refills
Start: 2021-02-18 — End: ?

## 2021-02-18 MED ORDER — SULFAMETHOXAZOLE-TRIMETHOPRIM 400-80 MG PO TAB
ORAL_TABLET | Freq: Every day | 3 refills
Start: 2021-02-18 — End: ?

## 2021-02-20 ENCOUNTER — Encounter: Admit: 2021-02-20 | Discharge: 2021-02-20 | Payer: MEDICARE

## 2021-03-26 ENCOUNTER — Encounter: Admit: 2021-03-26 | Discharge: 2021-03-26 | Payer: MEDICARE

## 2021-03-27 ENCOUNTER — Encounter: Admit: 2021-03-27 | Discharge: 2021-03-27 | Payer: MEDICARE

## 2021-03-27 DIAGNOSIS — C8311 Mantle cell lymphoma, lymph nodes of head, face, and neck: Secondary | ICD-10-CM

## 2021-03-28 ENCOUNTER — Encounter: Admit: 2021-03-28 | Discharge: 2021-03-28 | Payer: MEDICARE

## 2021-04-02 ENCOUNTER — Encounter: Admit: 2021-04-02 | Discharge: 2021-04-02 | Payer: MEDICARE

## 2021-04-07 ENCOUNTER — Encounter: Admit: 2021-04-07 | Discharge: 2021-04-07 | Payer: MEDICARE

## 2021-04-09 ENCOUNTER — Encounter: Admit: 2021-04-09 | Discharge: 2021-04-09 | Payer: MEDICARE

## 2021-04-09 DIAGNOSIS — C61 Malignant neoplasm of prostate: Secondary | ICD-10-CM

## 2021-04-09 DIAGNOSIS — E785 Hyperlipidemia, unspecified: Secondary | ICD-10-CM

## 2021-04-09 DIAGNOSIS — I10 Essential (primary) hypertension: Secondary | ICD-10-CM

## 2021-04-09 DIAGNOSIS — C8311 Mantle cell lymphoma, lymph nodes of head, face, and neck: Secondary | ICD-10-CM

## 2021-04-09 LAB — COMPREHENSIVE METABOLIC PANEL
ALBUMIN: 4.3 g/dL — ABNORMAL LOW (ref 3.5–5.0)
ALK PHOSPHATASE: 73 U/L — ABNORMAL HIGH (ref 25–110)
ALT: 20 U/L (ref 7–56)
ANION GAP: 8 K/UL — ABNORMAL LOW (ref 3–12)
AST: 17 U/L (ref 7–40)
CALCIUM: 9.2 mg/dL (ref 8.5–10.6)
CO2: 28 MMOL/L (ref 21–30)
CREATININE: 0.9 mg/dL (ref 0.4–1.24)
EGFR: >60 mL/min — ABNORMAL HIGH (ref >60)
POTASSIUM: 3.9 MMOL/L (ref 3.5–5.1)
SODIUM: 138 MMOL/L (ref 137–147)
TOTAL BILIRUBIN: 0.6 mg/dL (ref 0.3–1.2)
TOTAL PROTEIN: 6.4 g/dL (ref 6.0–8.0)

## 2021-04-09 LAB — CBC AND DIFF
ABSOLUTE BASO COUNT: 0.1 K/UL (ref 0–0.20)
ABSOLUTE EOS COUNT: 0 K/UL (ref 0–0.45)
RBC COUNT: 4.7 M/UL (ref 4.4–5.5)
WBC COUNT: 5.6 K/UL (ref 4.5–11.0)

## 2021-04-09 LAB — IMMUNOGLOBULINS-IGA,IGG,IGM
IGA: 119 mg/dL (ref 70–390)
IGG: 609 mg/dL — ABNORMAL LOW (ref 762–1488)
IGM: <20 mg/dL — ABNORMAL LOW (ref 38–328)

## 2021-04-09 MED ORDER — HEPARIN, PORCINE (PF) 100 UNIT/ML IV SYRG
500 [IU] | Freq: Once | 0 refills | Status: CP
Start: 2021-04-09 — End: ?

## 2021-04-09 MED ORDER — TIXAGEVIMAB-CILGAVIMAB 150 MG/1.5 ML- 150 MG/1.5 ML IM SOLN
600 mg | Freq: Once | INTRAMUSCULAR | 0 refills | Status: CP
Start: 2021-04-09 — End: ?
  Administered 2021-04-09: 17:00:00 600 mg via INTRAMUSCULAR

## 2021-04-09 NOTE — Progress Notes
Evushield; Day 182, Cycle 1    Pt given Evushield injections. Pt tolerated last Evushield injections and denied any issues with the last one. Pt tolerated Evushield and denied any questions or concerns. Pt was observed and then discharged ambulatory in stable condition.

## 2021-05-13 ENCOUNTER — Encounter: Admit: 2021-05-13 | Discharge: 2021-05-13 | Payer: MEDICARE

## 2021-05-26 ENCOUNTER — Encounter: Admit: 2021-05-26 | Discharge: 2021-05-26 | Payer: MEDICARE

## 2021-05-27 ENCOUNTER — Encounter: Admit: 2021-05-27 | Discharge: 2021-05-27 | Payer: MEDICARE

## 2021-05-27 NOTE — Progress Notes
Name: Robert Williams          MRN: [id number]      DOB: Sep 26, 1945      AGE: 76 y.o.   DATE OF SERVICE: 05/28/2021    Subjective:             Reason for Visit:  Follow Up      Robert Williams is a 76 y.o. male.     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 Mikey Bussing with mantle cell lymphoma.  He completed 6 cycles of of bendamustine and Rituxan.  He obtained a CR. He is here for maintenance Rituxan.  He started maintenance Rituxan in April 2021.     He is doing well with no new complaints. 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  No HEENT, cardiovascular, pulmonary, dermatology, GU or GI complaints.   He follows with his PCP routinely.     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  Presented August 2020 with worsening left supraclavicular adenopathy.  Initial chest x-ray imaging showed pathologically enlarged nodes.  2.  01/13/2019 CT scans of the neck and chest showed bilateral cervical and supraclavicular adenopathy.  3.  01/17/2019 FNA of supraclavicular node revealed CD5-positive monoclonal B-cell population consistent with mantle cell lymphoma.  FISH for t(11;14) was positive, confirming the diagnosis.  4.  02/07/2019 PET/CT revealed hypermetabolic adenopathy above and below the diaphragm with no extranodal sites of disease.  Staging bone marrow biopsy was negative.  5.  Sept 2020 - March 2021 BR x 6 with CR.  6.  Rituximab maintenance     He is alone.  He is married.     Review of Systems        Objective:         ? aspirin 325 mg tablet Take 1 Tab by mouth daily.   ? losartan-hydrochlorothiazide (HYZAAR) 50-12.5 mg tablet Take 1 tablet by mouth daily.     Vitals:    05/28/21 1024   BP: 126/76   BP Source: Arm, Left Upper   Pulse: 73   Temp: 36.6 ?C (97.8 ?F)   Resp: 16   SpO2: 98%   TempSrc: Oral   PainSc: Zero   Weight: 115.5 kg (254 lb 9.6 oz)     Body mass index is 34.56 kg/m?[name]     Pain Score: Zero         Pain Addressed:  N/A  [place] Cooperative Oncology Group performance status is 0, Fully active, able to carry on all pre-disease performance without restriction.[name]     Physical Exam  Vitals reviewed.   Constitutional:       Appearance: He is well-developed.   HENT:      Head: Normocephalic.   Cardiovascular:      Rate and Rhythm: Normal rate and regular rhythm.   Pulmonary:      Effort: Pulmonary effort is normal.      Breath sounds: Normal breath sounds.   Abdominal:      Palpations: Abdomen is soft.   Musculoskeletal:         General: Normal range of motion.  Cervical back: Normal range of motion.   Lymphadenopathy:      Cervical: No cervical adenopathy.      Upper Body:      Right upper body: No supraclavicular or axillary adenopathy.      Left upper body: No supraclavicular or axillary adenopathy.   Skin:     General: Skin is warm and dry.   Neurological:      Mental Status: He is alert and oriented to person, place, and time.            CBC w/Diff    Lab Results   Component Value Date/Time    WBC 5.8 05/28/2021 09:42 AM    RBC 4.87 05/28/2021 09:42 AM    HGB 14.4 05/28/2021 09:42 AM    HCT 44.5 05/28/2021 09:42 AM    MCV 91.3 05/28/2021 09:42 AM    MCH 29.6 05/28/2021 09:42 AM    MCHC 32.5 05/28/2021 09:42 AM    RDW 13.4 05/28/2021 09:42 AM    PLTCT 258 05/28/2021 09:42 AM    MPV 8.1 05/28/2021 09:42 AM    Lab Results   Component Value Date/Time    NEUT 72 05/28/2021 09:42 AM    ANC 4.20 05/28/2021 09:42 AM    LYMA 14 (L) 05/28/2021 09:42 AM    ALC 0.80 (L) 05/28/2021 09:42 AM    MONA 12 05/28/2021 09:42 AM    AMC 0.70 05/28/2021 09:42 AM    EOSA 1 05/28/2021 09:42 AM    AEC 0.10 05/28/2021 09:42 AM    BASA 1 05/28/2021 09:42 AM    ABC 0.10 05/28/2021 09:42 AM        Comprehensive Metabolic Profile Lab Results   Component Value Date/Time    NA 137 05/28/2021 09:42 AM    K 3.9 05/28/2021 09:42 AM    CL 103 05/28/2021 09:42 AM    CO2 26 05/28/2021 09:42 AM    GAP 8 05/28/2021 09:42 AM    BUN 17 05/28/2021 09:42 AM    CR 0.97 05/28/2021 09:42 AM    GLU 106 (H) 05/28/2021 09:42 AM    Lab Results   Component Value Date/Time    CA 8.8 05/28/2021 09:42 AM    PO4 2.8 01/27/2019 09:14 AM    ALBUMIN 4.1 05/28/2021 09:42 AM    TOTPROT 6.4 05/28/2021 09:42 AM    ALKPHOS 73 05/28/2021 09:42 AM    AST 19 05/28/2021 09:42 AM    ALT 28 05/28/2021 09:42 AM    TOTBILI 0.6 05/28/2021 09:42 AM    GFR >60 04/24/2020 09:35 AM    GFRAA >60 04/24/2020 09:35 AM        Cholesterol 195, triglycerides 151, HDL 41, LDL 136     Assessment and Plan:    1. Stage III mantle cell lymphoma.  He completed 6 cycles of BR and obtained CR.   He started maintenance Rituxan April 2021 is receiving it every 2 months.  He will receive it today as scheduled.  He will follow-up with Dr. Mikey Bussing in March 2023.  He is doing well and has no residual side effects from treatment.  He remains active.  2. 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 He received Prevnar in 2017.  He received his COVID booster in September 2021. Received recent pneumonia injection at [facility].  4. Shingrix today and when he returns to see Dr. Mikey Bussing in 2 months.  5. He will continue to follow  with his primary doctor, Dr. Kari Baars.  Lipid profile was drawn today. He takes atorvastatin 20mg /day.

## 2021-05-28 ENCOUNTER — Encounter: Admit: 2021-05-28 | Discharge: 2021-05-28 | Payer: MEDICARE

## 2021-05-28 DIAGNOSIS — E785 Hyperlipidemia, unspecified: Secondary | ICD-10-CM

## 2021-05-28 DIAGNOSIS — I10 Essential (primary) hypertension: Secondary | ICD-10-CM

## 2021-05-28 DIAGNOSIS — C8311 Mantle cell lymphoma, lymph nodes of head, face, and neck: Secondary | ICD-10-CM

## 2021-05-28 DIAGNOSIS — C61 Malignant neoplasm of prostate: Secondary | ICD-10-CM

## 2021-05-28 MED ORDER — DEXAMETHASONE 4 MG PO TAB
2 mg | Freq: Once | ORAL | 0 refills | Status: CP
Start: 2021-05-28 — End: ?
  Administered 2021-05-28: 17:00:00 2 mg via ORAL

## 2021-05-28 MED ORDER — RITUXIMAB-PVVR IVPB (SPEC VOL)
375 mg/m2 | Freq: Once | INTRAVENOUS | 0 refills | Status: CP
Start: 2021-05-28 — End: ?
  Administered 2021-05-28 (×3): 850 mg via INTRAVENOUS

## 2021-05-28 MED ORDER — DIPHENHYDRAMINE HCL 25 MG PO CAP
25 mg | Freq: Once | ORAL | 0 refills | Status: CP
Start: 2021-05-28 — End: ?
  Administered 2021-05-28: 17:00:00 25 mg via ORAL

## 2021-05-28 MED ORDER — ZOSTER VACCINE RECOMBINANT LYOPHILIZED ANTIGEN 2 OF 2 50 MCG IM SUSR
.5 mL | Freq: Once | INTRAMUSCULAR | 0 refills | Status: CN
Start: 2021-05-28 — End: ?

## 2021-05-28 MED ORDER — ZOSTER VACCINE RECOMBINANT ADJUVANT AS01B (PF)VIAL 1 OF 2 IM SUSP
1 | Freq: Once | INTRAMUSCULAR | 0 refills
Start: 2021-05-28 — End: ?

## 2021-05-28 MED ORDER — ZOSTER VACCINE RECOMBINANT LYOPHILIZED ANTIGEN 2 OF 2 50 MCG IM SUSR
.5 mL | Freq: Once | INTRAMUSCULAR | 0 refills | Status: CP
Start: 2021-05-28 — End: ?
  Administered 2021-05-28: 19:00:00 0.5 mL via INTRAMUSCULAR

## 2021-05-28 MED ORDER — ACETAMINOPHEN 325 MG PO TAB
650 mg | Freq: Once | ORAL | 0 refills | Status: CP
Start: 2021-05-28 — End: ?
  Administered 2021-05-28: 17:00:00 650 mg via ORAL

## 2021-05-28 MED ORDER — ZOSTER VACCINE RECOMBINANT ADJUVANT AS01B (PF)VIAL 1 OF 2 IM SUSP
1 | Freq: Once | INTRAMUSCULAR | 0 refills | Status: CP
Start: 2021-05-28 — End: ?

## 2021-05-28 MED ORDER — ZOSTER VACCINE RECOMBINANT ADJUVANT AS01B (PF)VIAL 1 OF 2 IM SUSP
1 | Freq: Once | INTRAMUSCULAR | 0 refills | Status: CN
Start: 2021-05-28 — End: ?

## 2021-05-28 MED ORDER — ZOSTER VACCINE RECOMBINANT LYOPHILIZED ANTIGEN 2 OF 2 50 MCG IM SUSR
.5 mL | Freq: Once | INTRAMUSCULAR | 0 refills
Start: 2021-05-28 — End: ?

## 2021-05-28 MED ORDER — HEPARIN, PORCINE (PF) 100 UNIT/ML IV SYRG
500 [IU] | Freq: Once | 0 refills | Status: CP
Start: 2021-05-28 — End: ?

## 2021-05-28 NOTE — Progress Notes
CHEMO NOTE  Verified chemo consent signed and in chart.    Blood return positive via: Port (Single)    BSA and dose double checked (agree with orders as written) with: yes with Carleen, RN    Labs/applicable tests checked: CBC and Comprehensive Metabolic Panel (CMP)    Chemo regimen: [id number] Rapid Rituxan     Rate verified and armband double check with second RN: yes    Patient education offered and stated understanding. Denies questions at this time.      Patient presented to clinic for treatment. Patient's port accessed with positive blood return. Patient's labs okay to treat. Patient tolerated infusion well. Patient's port flushed with heparin and de-accessed per protocol. Patient discharged off unit in stable condition.

## 2021-05-28 NOTE — Patient Instructions
Call Immediately to report the following:  Uncontrolled nausea and/or vomiting, uncontrolled pain, or unusual bleeding.  Temperature of 100.4 F or greater and/or any sign/symptom of infection (redness, warmth, tenderness)  Painful mouth or difficulty swallowing  Red, cracked, or painful hands and/or feet  Diarrhea   Swelling of arms or legs  Rash    Important Phone Numbers:  [facility] Main Number (answered 24 hours a day) [phone]  Cancer Center Scheduling (appointments) [phone] OR [phone]  [facility] (for nutritional supplements) [phone]        Port Maintenance - If you have a port, it should be flushed every 6-8 weeks when not in use.  Please check with your MD, nurse, or the scheduler.

## 2021-07-21 ENCOUNTER — Encounter: Admit: 2021-07-21 | Discharge: 2021-07-21 | Payer: MEDICARE

## 2021-07-21 DIAGNOSIS — C8311 Mantle cell lymphoma, lymph nodes of head, face, and neck: Secondary | ICD-10-CM

## 2021-07-21 LAB — POC CREATININE, RAD: CREATININE, POC: 1 mg/dL (ref 0.4–1.24)

## 2021-07-21 MED ORDER — SODIUM CHLORIDE 0.9 % IJ SOLN
50 mL | Freq: Once | INTRAVENOUS | 0 refills | Status: CP
Start: 2021-07-21 — End: ?
  Administered 2021-07-21: 17:00:00 50 mL via INTRAVENOUS

## 2021-07-21 MED ORDER — IOHEXOL 350 MG IODINE/ML IV SOLN
100 mL | Freq: Once | INTRAVENOUS | 0 refills | Status: CP
Start: 2021-07-21 — End: ?
  Administered 2021-07-21: 17:00:00 100 mL via INTRAVENOUS

## 2021-07-21 MED ORDER — HEPARIN, PORCINE (PF) 100 UNIT/ML IV SYRG
500 [IU] | Freq: Once | 0 refills | Status: CP
Start: 2021-07-21 — End: ?

## 2021-07-23 ENCOUNTER — Encounter: Admit: 2021-07-23 | Discharge: 2021-07-23 | Payer: MEDICARE

## 2021-07-23 DIAGNOSIS — C8311 Mantle cell lymphoma, lymph nodes of head, face, and neck: Secondary | ICD-10-CM

## 2021-07-23 DIAGNOSIS — E785 Hyperlipidemia, unspecified: Secondary | ICD-10-CM

## 2021-07-23 DIAGNOSIS — C61 Malignant neoplasm of prostate: Secondary | ICD-10-CM

## 2021-07-23 DIAGNOSIS — I10 Essential (primary) hypertension: Secondary | ICD-10-CM

## 2021-07-23 LAB — IMMUNOGLOBULINS-IGA,IGG,IGM
IGA: 113 mg/dL (ref 70–390)
IGG: 611 mg/dL — ABNORMAL LOW (ref 762–1488)
IGM: <20 mg/dL — ABNORMAL LOW (ref 38–328)

## 2021-07-23 MED ORDER — PNEUMOCOCCAL 23-VAL PS VACCINE 25 MCG/0.5 ML IJ SOLN
0.5 mL | Freq: Once | INTRAMUSCULAR | 0 refills | Status: CN
Start: 2021-07-23 — End: ?

## 2021-07-23 MED ORDER — RITUXIMAB-PVVR IVPB (SPEC VOL)
375 mg/m2 | Freq: Once | INTRAVENOUS | 0 refills | Status: CP
Start: 2021-07-23 — End: ?
  Administered 2021-07-23 (×3): 900 mg via INTRAVENOUS

## 2021-07-23 MED ORDER — ZOSTER VACCINE RECOMBINANT ADJUVANT AS01B (PF)VIAL 1 OF 2 IM SUSP
1 | Freq: Once | INTRAMUSCULAR | 0 refills | Status: CP
Start: 2021-07-23 — End: ?

## 2021-07-23 MED ORDER — DIPHENHYDRAMINE HCL 25 MG PO CAP
25 mg | Freq: Once | ORAL | 0 refills
Start: 2021-07-23 — End: ?

## 2021-07-23 MED ORDER — ACETAMINOPHEN 325 MG PO TAB
650 mg | Freq: Once | ORAL | 0 refills
Start: 2021-07-23 — End: ?

## 2021-07-23 MED ORDER — PNEUMOCOCCAL 23-VAL PS VACCINE 25 MCG/0.5 ML IJ SOLN
.5 mL | Freq: Once | INTRAMUSCULAR | 0 refills | Status: CP
Start: 2021-07-23 — End: ?
  Administered 2021-07-23: 17:00:00 0.5 mL via INTRAMUSCULAR

## 2021-07-23 MED ORDER — ZOSTER VACCINE RECOMBINANT LYOPHILIZED ANTIGEN 2 OF 2 50 MCG IM SUSR
.5 mL | Freq: Once | INTRAMUSCULAR | 0 refills | Status: CP
Start: 2021-07-23 — End: ?
  Administered 2021-07-23: 17:00:00 0.5 mL via INTRAMUSCULAR

## 2021-07-23 MED ORDER — ACETAMINOPHEN 325 MG PO TAB
650 mg | Freq: Once | ORAL | 0 refills | Status: CP
Start: 2021-07-23 — End: ?
  Administered 2021-07-23: 16:00:00 650 mg via ORAL

## 2021-07-23 MED ORDER — HEPARIN, PORCINE (PF) 100 UNIT/ML IV SYRG
500 [IU] | Freq: Once | 0 refills | Status: CP
Start: 2021-07-23 — End: ?

## 2021-07-23 MED ORDER — DEXAMETHASONE 4 MG PO TAB
2 mg | Freq: Once | ORAL | 0 refills | Status: CP
Start: 2021-07-23 — End: ?
  Administered 2021-07-23: 16:00:00 2 mg via ORAL

## 2021-07-23 MED ORDER — DIPHENHYDRAMINE HCL 25 MG PO CAP
25 mg | Freq: Once | ORAL | 0 refills | Status: CP
Start: 2021-07-23 — End: ?
  Administered 2021-07-23: 16:00:00 25 mg via ORAL

## 2021-07-23 NOTE — Progress Notes
Day 1 Cycle 3 Rituxan       Port accessed, labs collected.  Patient to follow up with Dr. Heber Carolina  OK to treat, labs OK to treat.  Tolerated infusion well.   Shingrix + Pneumovax tolerated well.  Port de-accessed per protocol, heparinized.   Discharged in stable condition.         CHEMO NOTE  Verified chemo consent signed and in chart.    Blood return positive via: Port (Single)    BSA and dose double checked (agree with orders as written) with: yes     Labs/applicable tests checked: CBC and Comprehensive Metabolic Panel (CMP)    Chemo regimen: Drug/cycle/day C3 D1 Rituxan    Rate verified and armband double check with second RN: yes, see eMar    Patient education offered and stated understanding. Denies questions at this time.

## 2021-07-23 NOTE — Progress Notes
Name: Robert Williams          MRN: [id number]      DOB: [birth year]      AGE: 76 y.o.   DATE OF SERVICE: 07/23/2021    Subjective:             Reason for Visit:  Follow Up      Breckan Ochsner is a 76 y.o. male.      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  Presented August 2020 with worsening left supraclavicular adenopathy.  Initial chest x-ray imaging showed pathologically enlarged nodes.  2.  01/13/2019 CT scans of the neck and chest showed bilateral cervical and supraclavicular adenopathy.  3.  01/17/2019 FNA of supraclavicular node revealed CD5-positive monoclonal B-cell population consistent with mantle cell lymphoma.  FISH for t(11;14) was positive, confirming the diagnosis.  4.  02/07/2019 PET/CT revealed hypermetabolic adenopathy above and below the diaphragm with no extranodal sites of disease.  Staging bone marrow biopsy was negative.  5.  Sept 2020 - March 2021 BR x 6 with CR.  6.  Rituximab maintenance initiated 08/2019    Interim History:  Going great.  No new or concerning issues.  Denies new or progressive adenopathy, fevers, drenching night sweats or unintentional weight loss.  No issues with rituximab.    I have reviewed and updated the past medical, social and family histories in the history section and they are up to date as of this visit.    I have extensively reviewed the laboratory, pathology and radiology, both internal and external, and the key findings are summarized above.         Review of Systems   All other systems reviewed and are negative.        Objective:         ? aspirin 325 mg tablet Take 1 Tab by mouth daily.   ? losartan-hydrochlorothiazide (HYZAAR) 50-12.5 mg tablet Take one tablet by mouth daily.       Vitals:    07/23/21 0933   BP: 118/68   BP Source: Arm, Left Upper   Pulse: 71   Temp: 36.7 ?C (98 ?F)   Resp: 16   SpO2: 97%   TempSrc: Oral   PainSc: Zero   Weight: 114.1 kg (251 lb 9.6 oz)     Body mass index is 34.15 kg/m?[name]     Pain Score: Zero       Fatigue Scale: 0-None    Pain Addressed:  N/A    Patient Evaluated for a Clinical Trial: Patient not eligible for a treatment trial (including not needing treatment, needs palliative care, in remission).     [place] Cooperative Oncology Group performance status is 0, Fully active, able to carry on all pre-disease performance without restriction.     Physical Exam  Vitals and nursing note reviewed.   Constitutional:       General: He is not in acute distress.  HENT:      Head: Normocephalic and atraumatic.   Eyes:      General: No scleral icterus.  Cardiovascular:  Rate and Rhythm: Normal rate and regular rhythm.   Pulmonary:      Effort: Pulmonary effort is normal.      Breath sounds: Normal breath sounds.   Abdominal:      General: There is no distension.      Palpations: Abdomen is soft. There is no mass.   Musculoskeletal:         General: Normal range of motion.      Cervical back: Neck supple.   Lymphadenopathy:      Comments:   No palpable cervical, supraclavicular, axillary or inguinal adenopathy.   Skin:     General: Skin is warm.      Findings: No rash.   Neurological:      Mental Status: He is alert.               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 Stage III mantle cell lymphoma  2. 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 Hypertension  4. Hyperlipidemia  5. ECOG PS 0    Plan:  Annual CT scans reviewed personally and show an ongoing complete remission.  This is excellent news.  Clinically he is doing well and tolerating maintenance without issues.  He has not developed any significant hypogammaglobulinemia and we will continue rituximab maintenance for total of 3 years per protocol.  Continue rituximab 375 mg/m? IV every 2 months for 3 years with C12 D1 = 07/23/2021.  Monitor immunoglobulins and CD4 count every 6 months  Continue acyclovir 800 mg p.o. twice daily  Continue Bactrim SS daily.  Restage annually.  His next CT scans of the chest abdomen pelvis with contrast would routinely be due in late winter 2024.  Due to his history of lymphoma, he has an elevated risk of infections and secondary malignancies.  He additionally should have aggressive age-appropriate cancer screening.  He received Prevnar.  He will get Pneumovax today.  He has received his first dose of Shingrix and will get his second dose of Shingrix today.    He is up-to-date on all indicated coronavirus vaccines.  RTC with [name] in 2 months, with me in 4 months, or sooner should new or concerning symptoms develop.    I have reviewed the diagnostic and treatment plan in detail with the patient and he is in agreement with the above approach.

## 2021-07-23 NOTE — Patient Instructions
Call Immediately to report the following:  Uncontrolled nausea and/or vomiting, uncontrolled pain, or unusual bleeding.  Temperature of 100.4 F or greater and/or any sign/symptom of infection (redness, warmth, tenderness)  Painful mouth or difficulty swallowing  Red, cracked, or painful hands and/or feet  Diarrhea   Swelling of arms or legs  Rash    Important Phone Numbers:  [facility] Main Number (answered 24 hours a day) [phone]  Cancer Center Scheduling (appointments) [phone] OR [phone]  [facility] (for nutritional supplements) [phone]        Port Maintenance - If you have a port, it should be flushed every 6-8 weeks when not in use.  Please check with your MD, nurse, or the scheduler.

## 2021-09-17 ENCOUNTER — Encounter: Admit: 2021-09-17 | Discharge: 2021-09-17 | Payer: MEDICARE

## 2021-09-17 DIAGNOSIS — C61 Malignant neoplasm of prostate: Secondary | ICD-10-CM

## 2021-09-17 DIAGNOSIS — C8311 Mantle cell lymphoma, lymph nodes of head, face, and neck: Secondary | ICD-10-CM

## 2021-09-17 DIAGNOSIS — I10 Essential (primary) hypertension: Secondary | ICD-10-CM

## 2021-09-17 DIAGNOSIS — E785 Hyperlipidemia, unspecified: Secondary | ICD-10-CM

## 2021-09-17 MED ORDER — DIPHENHYDRAMINE HCL 25 MG PO CAP
25 mg | Freq: Once | ORAL | 0 refills | Status: CP
Start: 2021-09-17 — End: ?
  Administered 2021-09-17: 15:00:00 25 mg via ORAL

## 2021-09-17 MED ORDER — DEXAMETHASONE 4 MG PO TAB
2 mg | Freq: Once | ORAL | 0 refills | Status: CP
Start: 2021-09-17 — End: ?
  Administered 2021-09-17: 15:00:00 2 mg via ORAL

## 2021-09-17 MED ORDER — ACETAMINOPHEN 325 MG PO TAB
650 mg | Freq: Once | ORAL | 0 refills | Status: CP
Start: 2021-09-17 — End: ?
  Administered 2021-09-17: 15:00:00 650 mg via ORAL

## 2021-09-17 MED ORDER — RITUXIMAB IVPB (SPEC VOL)
375 mg/m2 | Freq: Once | INTRAVENOUS | 0 refills | Status: CP
Start: 2021-09-17 — End: ?
  Administered 2021-09-17 (×3): 900 mg via INTRAVENOUS

## 2021-09-17 NOTE — Progress Notes
Day 57 Cycle 3 Rituxan      Port accessed, labs collected.  Patient to follow up with Dr. Heber Carolina   OK to treat, labs OK to treat.  Tolerated infusion well.   Port turbulently flushed and de-accessed per protocol.  Discharged in stable condition.           CHEMO NOTE  Verified chemo consent signed and in chart.    Blood return positive via: Port (Single)    BSA and dose double checked (agree with orders as written) with: yes     Labs/applicable tests checked: CBC and Comprehensive Metabolic Panel (CMP)    Chemo regimen: Drug/cycle/day  C3 D 57 Rituxan     Rate verified and armband double check with second RN: yes, see eMar    Patient education offered and stated understanding. Denies questions at this time.

## 2021-09-17 NOTE — Patient Instructions
Call Immediately to report the following:  Uncontrolled nausea and/or vomiting, uncontrolled pain, or unusual bleeding.  Temperature of 100.4 F or greater and/or any sign/symptom of infection (redness, warmth, tenderness)  Painful mouth or difficulty swallowing  Red, cracked, or painful hands and/or feet  Diarrhea   Swelling of arms or legs  Rash    Important Phone Numbers:  [facility] Main Number (answered 24 hours a day) [phone]  Cancer Center Scheduling (appointments) [phone] or [phone]  [facility] (for nutritional supplements) [phone]        Port Maintenance - If you have a port, it should be flushed every 6-8 weeks when not in use.  Please check with your MD, nurse, or the scheduler.

## 2021-09-17 NOTE — Progress Notes
Name: Robert Williams          MRN: [id number]      DOB: 04-16-1946      AGE: 76 y.o.   DATE OF SERVICE: 09/17/2021    Subjective:             Reason for Visit:  Follow Up      Robert Williams is a 77 y.o. male.      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  Presented August 2020 with worsening left supraclavicular adenopathy.  Initial chest x-ray imaging showed pathologically enlarged nodes.  2.  01/13/2019 CT scans of the neck and chest showed bilateral cervical and supraclavicular adenopathy.  3.  01/17/2019 FNA of supraclavicular node revealed CD5-positive monoclonal B-cell population consistent with mantle cell lymphoma.  FISH for t(11;14) was positive, confirming the diagnosis.  4.  02/07/2019 PET/CT revealed hypermetabolic adenopathy above and below the diaphragm with no extranodal sites of disease.  Staging bone marrow biopsy was negative.  5.  Sept 2020 - March 2021 BR x 6 with CR.  6.  Rituximab maintenance initiated 08/2019    Interim History:  Doing well.  No new or concerning issues.  Denies new or progressive adenopathy, fevers, drenching night sweats or unintentional weight loss.  No issues with rituximab.  No interim fevers or infections.    I have reviewed and updated the past medical, social and family histories in the history section and they are up to date as of this visit.    I have extensively reviewed the laboratory, pathology and radiology, both internal and external, and the key findings are summarized above.         Review of Systems   All other systems reviewed and are negative.        Objective:         ? aspirin 325 mg tablet Take 1 Tab by mouth daily.   ? losartan-hydrochlorothiazide (HYZAAR) 50-12.5 mg tablet Take one tablet by mouth daily.       Vitals:    09/17/21 0912   BP: (!) 149/68   BP Source: Arm, Left Upper   Pulse: 64   Temp: 36.4 ?C (97.5 ?F)   Resp: 16   SpO2: 99%   TempSrc: Oral   PainSc: Zero   Weight: 115 kg (253 lb 9.6 oz)     Body mass index is 34.42 kg/m?[name]     Pain Score: Zero       Fatigue Scale: 0-None    Pain Addressed:  N/A    Patient Evaluated for a Clinical Trial: Patient not eligible for a treatment trial (including not needing treatment, needs palliative care, in remission).     [place] Cooperative Oncology Group performance status is 0, Fully active, able to carry on all pre-disease performance without restriction.[name]     Physical Exam  Vitals and nursing note reviewed.   Constitutional:       General: He is not in acute distress.  HENT:      Head: Normocephalic and atraumatic.   Eyes:      General:  No scleral icterus.  Cardiovascular:      Rate and Rhythm: Normal rate and regular rhythm.   Pulmonary:      Effort: Pulmonary effort is normal.      Breath sounds: Normal breath sounds.   Abdominal:      General: There is no distension.      Palpations: Abdomen is soft. There is no mass.   Musculoskeletal:         General: Normal range of motion.      Cervical back: Neck supple.   Lymphadenopathy:      Comments:   No palpable cervical, supraclavicular, axillary or inguinal adenopathy.   Skin:     General: Skin is warm.      Findings: No rash.   Neurological:      Mental Status: He is alert.               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 Stage III mantle cell lymphoma  2. 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 Hypertension  4. Hyperlipidemia  5. ECOG PS 0    Plan:  Clinically he is doing well and tolerating maintenance without issues.  He has not developed any significant hypogammaglobulinemia and we will continue rituximab maintenance for total of 3 years per protocol.  Continue rituximab 375 mg/m? IV every 2 months for 3 years with C13 D1 = 09/17/2021.  Monitor immunoglobulins and CD4 count every 6 months.  His CD4 count has recovered to well over 200 and he may discontinue acyclovir and Bactrim prophylaxis.  Restage annually.  His next CT scans of the chest abdomen pelvis with contrast would routinely be due in late winter 2024.  Due to his history of lymphoma, he has an elevated risk of infections and secondary malignancies.  He additionally should have aggressive age-appropriate cancer screening.  He is up-to-date on all indicated coronavirus vaccines, has received his Shingrix vaccine, and is up-to-date on high-dose pneumococcal vaccinations.    RTC with [place] every 2 months concurrent with rituximab doses, or sooner should new or concerning symptoms develop.    I have reviewed the diagnostic and treatment plan in detail with the patient and he is in agreement with the above approach.

## 2021-11-12 ENCOUNTER — Encounter: Admit: 2021-11-12 | Discharge: 2021-11-12 | Payer: MEDICARE

## 2021-11-12 DIAGNOSIS — I10 Essential (primary) hypertension: Secondary | ICD-10-CM

## 2021-11-12 DIAGNOSIS — E785 Hyperlipidemia, unspecified: Secondary | ICD-10-CM

## 2021-11-12 DIAGNOSIS — C61 Malignant neoplasm of prostate: Secondary | ICD-10-CM

## 2021-11-12 DIAGNOSIS — C8311 Mantle cell lymphoma, lymph nodes of head, face, and neck: Secondary | ICD-10-CM

## 2021-11-12 LAB — IMMUNOGLOBULINS-IGA,IGG,IGM
IGA: 106 mg/dL (ref 70–390)
IGG: 554 mg/dL — ABNORMAL LOW (ref 762–1488)
IGM: <20 mg/dL — ABNORMAL LOW (ref 38–328)

## 2021-11-12 MED ORDER — DIPHENHYDRAMINE HCL 25 MG PO CAP
25 mg | Freq: Once | ORAL | 0 refills | Status: CP
Start: 2021-11-12 — End: ?
  Administered 2021-11-12: 15:00:00 25 mg via ORAL

## 2021-11-12 MED ORDER — DEXAMETHASONE 4 MG PO TAB
2 mg | Freq: Once | ORAL | 0 refills | Status: CP
Start: 2021-11-12 — End: ?
  Administered 2021-11-12: 15:00:00 2 mg via ORAL

## 2021-11-12 MED ORDER — ACETAMINOPHEN 325 MG PO TAB
650 mg | Freq: Once | ORAL | 0 refills | Status: CP
Start: 2021-11-12 — End: ?
  Administered 2021-11-12: 15:00:00 650 mg via ORAL

## 2021-11-12 MED ORDER — RITUXIMAB IVPB (SPEC VOL)
375 mg/m2 | Freq: Once | INTRAVENOUS | 0 refills | Status: CP
Start: 2021-11-12 — End: ?
  Administered 2021-11-12 (×3): 900 mg via INTRAVENOUS

## 2021-11-12 NOTE — Progress Notes
Name: Robert Williams          MRN: [id number]      DOB: 1946/02/27      AGE: 76 y.o.   DATE OF SERVICE: 11/12/2021    Subjective:             Reason for Visit:  Follow Up      Robert Williams is a 76 y.o. male.      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 Mikey Bussing with mantle cell lymphoma.  He completed 6 cycles of of bendamustine and Rituxan.  He obtained a CR. He is here for maintenance Rituxan.  He started maintenance Rituxan in April 2021.     He is doing well with no new complaints.   Just returned from travel significantly known to see family-had a good trip  He continues to work as a Programmer, systems  He remains active  No HEENT, cardiovascular, pulmonary, dermatology, GU or GI complaints   He has a PCP but does not see routinely  Does not take his atorvastatin routinely    HPI  1.  Presented August 2020 with worsening left supraclavicular adenopathy.  Initial chest x-ray imaging showed pathologically enlarged nodes.  2.  01/13/2019 CT scans of the neck and chest showed bilateral cervical and supraclavicular adenopathy.  3.  01/17/2019 FNA of supraclavicular node revealed CD5-positive monoclonal B-cell population consistent with mantle cell lymphoma.  FISH for t(11;14) was positive, confirming the diagnosis.  4.  02/07/2019 PET/CT revealed hypermetabolic adenopathy above and below the diaphragm with no extranodal sites of disease.  Staging bone marrow biopsy was negative.  5.  Sept 2020 - March 2021 BR x 6 with CR.  6.  Rituximab maintenance     He is alone.  He is married.     Review of Systems        Objective:         ? aspirin 325 mg tablet Take 1 Tab by mouth daily.   ? losartan-hydrochlorothiazide (HYZAAR) 50-12.5 mg tablet Take one tablet by mouth daily.     Vitals:    11/12/21 0929 11/12/21 0930   BP: 128/74    BP Source: Arm, Left Upper Arm, Left Upper   Pulse: 67    Temp: 36.7 ?C (98 ?F)    Resp: 18    SpO2: 97%    O2 Device:  None (Room air)   TempSrc: Oral Oral   PainSc: Zero Zero   Weight: 113.8 kg (250 lb 12.8 oz)      Body mass index is 34.04 kg/m?[name]     Pain Score: Zero         Pain Addressed:  N/A  [place] Cooperative Oncology Group performance status is 0, Fully active, able to carry on all pre-disease performance without restriction.[name]     Physical Exam  Vitals reviewed.   Constitutional:       Appearance: He is well-developed.   HENT:      Head: Normocephalic.   Cardiovascular:      Rate and Rhythm: Normal rate and regular rhythm.   Pulmonary:      Effort: Pulmonary effort is normal.  Breath sounds: Normal breath sounds.   Abdominal:      Palpations: Abdomen is soft.   Musculoskeletal:         General: Normal range of motion.      Cervical back: Normal range of motion.   Lymphadenopathy:      Cervical: No cervical adenopathy.      Upper Body:      Right upper body: No supraclavicular or axillary adenopathy.      Left upper body: No supraclavicular or axillary adenopathy.   Skin:     General: Skin is warm and dry.   Neurological:      Mental Status: He is alert and oriented to person, place, and time.            CBC w/Diff    Lab Results   Component Value Date/Time    WBC 7.2 11/12/2021 08:41 AM    RBC 4.77 11/12/2021 08:41 AM    HGB 14.0 11/12/2021 08:41 AM    HCT 42.8 11/12/2021 08:41 AM    MCV 89.7 11/12/2021 08:41 AM    MCH 29.3 11/12/2021 08:41 AM    MCHC 32.6 11/12/2021 08:41 AM    RDW 15.1 (H) 11/12/2021 08:41 AM    PLTCT 248 11/12/2021 08:41 AM    MPV 8.2 11/12/2021 08:41 AM    Lab Results   Component Value Date/Time    NEUT 72 11/12/2021 08:41 AM    ANC 5.20 11/12/2021 08:41 AM    LYMA 12 (L) 11/12/2021 08:41 AM    ALC 0.90 (L) 11/12/2021 08:41 AM    MONA 12 11/12/2021 08:41 AM    AMC 0.80 11/12/2021 08:41 AM    EOSA 2 11/12/2021 08:41 AM    AEC 0.20 11/12/2021 08:41 AM    BASA 2 11/12/2021 08:41 AM    ABC 0.10 11/12/2021 08:41 AM        Comprehensive Metabolic Profile    Lab Results   Component Value Date/Time    NA 141 09/17/2021 08:47 AM    K 3.8 09/17/2021 08:47 AM    CL 104 09/17/2021 08:47 AM    CO2 27 09/17/2021 08:47 AM    GAP 10 09/17/2021 08:47 AM    BUN 13 09/17/2021 08:47 AM    CR 1.10 09/17/2021 08:47 AM    GLU 109 (H) 09/17/2021 08:47 AM    Lab Results   Component Value Date/Time    CA 9.3 09/17/2021 08:47 AM    PO4 2.8 01/27/2019 09:14 AM    ALBUMIN 4.1 09/17/2021 08:47 AM    TOTPROT 6.2 09/17/2021 08:47 AM    ALKPHOS 72 09/17/2021 08:47 AM    AST 14 09/17/2021 08:47 AM    ALT 19 09/17/2021 08:47 AM    TOTBILI 0.7 09/17/2021 08:47 AM    GFR >60 04/24/2020 09:35 AM    GFRAA >60 04/24/2020 09:35 AM        Assessment and Plan:    1. Stage III mantle cell lymphoma.  He completed 6 cycles of BR and obtained CR.   He started maintenance Rituxan April 2021 is receiving it every 2 months.  He will receive it today as scheduled.  He will follow-up with me in 2 months and Dr. Mikey Bussing in October 2023.  He is doing well and has no side effects from treatment.  He remains active.  2. History of early-stage prostate cancer treated with prostatectomy and radiation therapy in 2003.  3. Shingrix completed in 2023.  He is up-to-date  with pneumonia vaccines.  4. He will continue to follow with his primary doctor, Dr. Herschell Dimes.  Lipid profile will be drawn in 2 months.  He takes atorvastatin 20mg /day most of the time.   5. He stopped cycling and Bactrim previously.  His CD4 has been above 200 for over a year.           ?

## 2021-11-12 NOTE — Progress Notes
CHEMO NOTE  Verified chemo consent signed and in chart.    Blood return positive via: Port (Single)    BSA and dose double checked (agree with orders as written) with: yes, see MAR.     Chemo regimen: Day 113, cycle 3; Rapid Rituxan    Rate verified and armband double check with second RN: yes    Patient education offered and stated understanding. Denies questions at this time.    Pt arrived for treatment. Port accessed and labs obtained. Pt seen by provider and ok to treat. Pre-medication given then Rapid Rituxan titrated per treatment plan. Pt tolerated and denied any questions or concerns. Port flushed and de-accessed. Pt discharged ambulatory.

## 2021-12-30 ENCOUNTER — Encounter: Admit: 2021-12-30 | Discharge: 2021-12-30 | Payer: MEDICARE

## 2021-12-30 DIAGNOSIS — C8311 Mantle cell lymphoma, lymph nodes of head, face, and neck: Secondary | ICD-10-CM

## 2022-01-06 NOTE — Progress Notes
Name: Robert Williams          MRN: [id number]      DOB: 10-29-1945      AGE: 76 y.o.   DATE OF SERVICE: 01/07/2022    Subjective:             Reason for Visit:  Heme/Onc Care      Robert Williams is a 76 y.o. male.      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 Mikey Bussing with mantle cell lymphoma.  He completed 6 cycles of of bendamustine and Rituxan.  He obtained a CR. He is here for maintenance Rituxan.  He started maintenance Rituxan in April 2021.     Subjective  He is doing well with no new complaints.   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  Presented August 2020 with worsening left supraclavicular adenopathy.  Initial chest x-ray imaging showed pathologically enlarged nodes.  2.  01/13/2019 CT scans of the neck and chest showed bilateral cervical and supraclavicular adenopathy.  3.  01/17/2019 FNA of supraclavicular node revealed CD5-positive monoclonal B-cell population consistent with mantle cell lymphoma.  FISH for t(11;14) was positive, confirming the diagnosis.  4.  02/07/2019 PET/CT revealed hypermetabolic adenopathy above and below the diaphragm with no extranodal sites of disease.  Staging bone marrow biopsy was negative.  5.  Sept 2020 - March 2021 BR x 6 with CR.  6.  Rituximab maintenance     He is alone.  He is married.     Review of Systems        Objective:         ? aspirin 325 mg tablet Take 1 Tab by mouth daily.   ? losartan-hydrochlorothiazide (HYZAAR) 50-12.5 mg tablet Take one tablet by mouth daily.     Vitals:    01/07/22 0922 01/07/22 0923 BP: 130/71    BP Source: Arm, Right Upper    Pulse: 73    Temp: 36.8 ?C (98.3 ?F)    Resp: 16    SpO2: 97%    O2 Device: None (Room air)    TempSrc: Oral    PainSc: Zero Zero   Weight: 112.9 kg (249 lb)      Body mass index is 33.8 kg/m?[name]     Pain Score: Zero         Pain Addressed:  N/A  [place] Cooperative Oncology Group performance status is 0, Fully active, able to carry on all pre-disease performance without restriction.[name]     Physical Exam  Vitals reviewed.   Constitutional:       Appearance: He is well-developed.   HENT:      Head: Normocephalic.   Cardiovascular:      Rate and Rhythm: Normal rate and regular rhythm.   Pulmonary:      Effort: Pulmonary effort is normal.      Breath sounds: Normal breath sounds.   Abdominal:      Palpations:  Abdomen is soft.   Musculoskeletal:         General: Normal range of motion.      Cervical back: Normal range of motion.   Lymphadenopathy:      Cervical: No cervical adenopathy.      Upper Body:      Right upper body: No supraclavicular or axillary adenopathy.      Left upper body: No supraclavicular or axillary adenopathy.   Skin:     General: Skin is warm and dry.   Neurological:      Mental Status: He is alert and oriented to person, place, and time.            CBC w/Diff    Lab Results   Component Value Date/Time    WBC 6.4 01/07/2022 09:02 AM    RBC 4.68 01/07/2022 09:02 AM    HGB 13.7 01/07/2022 09:02 AM    HCT 42.2 01/07/2022 09:02 AM    MCV 90.1 01/07/2022 09:02 AM    MCH 29.3 01/07/2022 09:02 AM    MCHC 32.5 01/07/2022 09:02 AM    RDW 14.6 01/07/2022 09:02 AM    PLTCT 219 01/07/2022 09:02 AM    MPV 8.1 01/07/2022 09:02 AM    Lab Results   Component Value Date/Time    NEUT 74 01/07/2022 09:02 AM    ANC 4.80 01/07/2022 09:02 AM    LYMA 12 (L) 01/07/2022 09:02 AM    ALC 0.80 (L) 01/07/2022 09:02 AM    MONA 11 01/07/2022 09:02 AM    AMC 0.70 01/07/2022 09:02 AM    EOSA 2 01/07/2022 09:02 AM    AEC 0.10 01/07/2022 09:02 AM    BASA 1 01/07/2022 09:02 AM    ABC 0.00 01/07/2022 09:02 AM        Comprehensive Metabolic Profile    Lab Results   Component Value Date/Time    NA 138 11/12/2021 08:41 AM    K 3.9 11/12/2021 08:41 AM    CL 102 11/12/2021 08:41 AM    CO2 28 11/12/2021 08:41 AM    GAP 8 11/12/2021 08:41 AM    BUN 15 11/12/2021 08:41 AM    CR 0.96 11/12/2021 08:41 AM    GLU 104 (H) 11/12/2021 08:41 AM    Lab Results   Component Value Date/Time    CA 8.8 11/12/2021 08:41 AM    PO4 2.8 01/27/2019 09:14 AM    ALBUMIN 4.2 11/12/2021 08:41 AM    TOTPROT 6.3 11/12/2021 08:41 AM    ALKPHOS 78 11/12/2021 08:41 AM    AST 16 11/12/2021 08:41 AM    ALT 16 11/12/2021 08:41 AM    TOTBILI 0.8 11/12/2021 08:41 AM    GFR >60 04/24/2020 09:35 AM    GFRAA >60 04/24/2020 09:35 AM        Assessment and Plan:    1. Stage III mantle cell lymphoma.  He completed 6 cycles of BR and obtained CR.   He started maintenance Rituxan April 2021 is receiving it every 2 months.  He will receive it today as scheduled.  He will see Dr. Mikey Bussing in October as scheduled.  He will proceed with 30-minute rapid infusion moving forward with Rituxan. He is doing well and has no side effects from treatment.  He remains active.  2. History of early-stage prostate cancer treated with prostatectomy and radiation therapy in 2003.  3. Shingrix completed in 2023.  He is up-to-date with pneumonia vaccines.  He plans to get the  flu shot and COVID shot in October 2023.  4. He will continue to follow with his primary doctor, Dr. Herschell Dimes.  5. He stoppedacyclovir and Bactrim previously.  His CD4 has been above 200 for over a year.           ?

## 2022-01-07 ENCOUNTER — Encounter: Admit: 2022-01-07 | Discharge: 2022-01-07 | Payer: MEDICARE

## 2022-01-07 DIAGNOSIS — C8311 Mantle cell lymphoma, lymph nodes of head, face, and neck: Secondary | ICD-10-CM

## 2022-01-07 DIAGNOSIS — I10 Essential (primary) hypertension: Secondary | ICD-10-CM

## 2022-01-07 DIAGNOSIS — C61 Malignant neoplasm of prostate: Secondary | ICD-10-CM

## 2022-01-07 DIAGNOSIS — E785 Hyperlipidemia, unspecified: Secondary | ICD-10-CM

## 2022-01-07 LAB — COMPREHENSIVE METABOLIC PANEL
ALBUMIN: 4 g/dL — ABNORMAL LOW (ref 3.5–5.0)
ALK PHOSPHATASE: 78 U/L (ref 25–110)
ALT: 19 U/L (ref 7–56)
ANION GAP: 8 K/UL — ABNORMAL LOW (ref 3–12)
AST: 16 U/L (ref 7–40)
BLD UREA NITROGEN: 17 mg/dL (ref 7–25)
CALCIUM: 9.1 mg/dL (ref 8.5–10.6)
CHLORIDE: 105 MMOL/L (ref 98–110)
CO2: 27 MMOL/L (ref 21–30)
CREATININE: 1 mg/dL (ref 0.4–1.24)
EGFR: >60 mL/min (ref >60)
GLUCOSE,PANEL: 109 mg/dL — ABNORMAL HIGH (ref 70–100)
POTASSIUM: 4 MMOL/L (ref 3.5–5.1)
SODIUM: 140 MMOL/L (ref 137–147)
TOTAL BILIRUBIN: 0.5 mg/dL (ref 0.3–1.2)
TOTAL PROTEIN: 6.4 g/dL (ref 6.0–8.0)

## 2022-01-07 LAB — CBC AND DIFF
ABSOLUTE BASO COUNT: 0 K/UL (ref 0–0.20)
ABSOLUTE EOS COUNT: 0.1 K/UL (ref 0–0.45)
RBC COUNT: 4.6 M/UL (ref 4.4–5.5)
WBC COUNT: 6.4 K/UL — ABNORMAL LOW (ref 4.5–11.0)

## 2022-01-07 LAB — IMMUNOGLOBULINS-IGA,IGG,IGM
IGA: 105 mg/dL (ref 70–390)
IGG: 521 mg/dL — ABNORMAL LOW (ref 762–1488)

## 2022-01-07 MED ORDER — ACETAMINOPHEN 325 MG PO TAB
650 mg | Freq: Once | ORAL | 0 refills | Status: CP
Start: 2022-01-07 — End: ?
  Administered 2022-01-07: 15:00:00 650 mg via ORAL

## 2022-01-07 MED ORDER — DEXAMETHASONE 4 MG PO TAB
2 mg | Freq: Once | ORAL | 0 refills | Status: CP
Start: 2022-01-07 — End: ?
  Administered 2022-01-07: 15:00:00 2 mg via ORAL

## 2022-01-07 MED ORDER — RITUXIMAB (RITUXAN) IVPB (FOR 30-MIN RAPID INFUSION)
375 mg/m2 | Freq: Once | INTRAVENOUS | 0 refills | Status: CP
Start: 2022-01-07 — End: ?
  Administered 2022-01-07 (×3): 900 mg via INTRAVENOUS

## 2022-01-07 MED ORDER — DIPHENHYDRAMINE HCL 25 MG PO CAP
25 mg | Freq: Once | ORAL | 0 refills | Status: CP
Start: 2022-01-07 — End: ?
  Administered 2022-01-07: 15:00:00 25 mg via ORAL

## 2022-01-07 NOTE — Progress Notes
CHEMO NOTE  Verified chemo consent signed and in chart.    Blood return positive via: Port (Single)    BSA and dose double checked (agree with orders as written) with: yes, see MAR.     Labs/applicable tests checked: None    Chemo regimen: Day 169, Cycle 3; Rapid Rituxan (30 minutes)    Rate verified and armband double check with second RN: yes    Patient education offered and stated understanding. Denies questions at this time.    Pt arrived for treatment. Port accessed and labs obtained. Pt seen by provider and ok to treat. Pre-medication given then infusion. Pt tolerated infusion without issues. Pt denied any questions or concerns. Port flushed and de-accessed. Pt discharged ambulatory.

## 2022-03-04 ENCOUNTER — Encounter: Admit: 2022-03-04 | Discharge: 2022-03-04 | Payer: MEDICARE

## 2022-03-04 DIAGNOSIS — I10 Essential (primary) hypertension: Secondary | ICD-10-CM

## 2022-03-04 DIAGNOSIS — C61 Malignant neoplasm of prostate: Secondary | ICD-10-CM

## 2022-03-04 DIAGNOSIS — E785 Hyperlipidemia, unspecified: Secondary | ICD-10-CM

## 2022-03-04 DIAGNOSIS — C8311 Mantle cell lymphoma, lymph nodes of head, face, and neck: Secondary | ICD-10-CM

## 2022-03-04 LAB — COMPREHENSIVE METABOLIC PANEL
ALBUMIN: 4.3 g/dL — ABNORMAL LOW (ref 3.5–5.0)
ALK PHOSPHATASE: 88 U/L (ref 25–110)
ALT: 22 U/L (ref 7–56)
ANION GAP: 11 K/UL (ref 3–12)
AST: 18 U/L (ref 7–40)
BLD UREA NITROGEN: 16 mg/dL (ref 7–25)
CALCIUM: 9.5 mg/dL (ref 8.5–10.6)
CHLORIDE: 102 MMOL/L (ref 98–110)
CO2: 26 MMOL/L (ref 21–30)
CREATININE: 0.8 mg/dL (ref 0.4–1.24)
EGFR: >60 mL/min — ABNORMAL HIGH (ref >60)
GLUCOSE,PANEL: 114 mg/dL — ABNORMAL HIGH (ref 70–100)
POTASSIUM: 3.8 MMOL/L (ref 3.5–5.1)
SODIUM: 139 MMOL/L (ref 137–147)
TOTAL BILIRUBIN: 0.6 mg/dL (ref 0.3–1.2)
TOTAL PROTEIN: 6.8 g/dL (ref 6.0–8.0)

## 2022-03-04 LAB — CBC AND DIFF
ABSOLUTE BASO COUNT: 0.1 K/UL (ref 0–0.20)
ABSOLUTE EOS COUNT: 0.1 K/UL (ref 0–0.45)
RBC COUNT: 4.8 M/UL (ref 4.4–5.5)
WBC COUNT: 8.2 K/UL — ABNORMAL LOW (ref 4.5–11.0)

## 2022-03-04 LAB — IMMUNOGLOBULINS-IGA,IGG,IGM
IGA: 108 mg/dL (ref 70–390)
IGG: 532 mg/dL — ABNORMAL LOW (ref 762–1488)

## 2022-03-04 MED ORDER — ACETAMINOPHEN 325 MG PO TAB
650 mg | Freq: Once | ORAL | 0 refills | Status: CP
Start: 2022-03-04 — End: ?
  Administered 2022-03-04: 15:00:00 650 mg via ORAL

## 2022-03-04 MED ORDER — RITUXIMAB (RITUXAN) IVPB (ULTRA RAPID 30 MIN INFUSION)
375 mg/m2 | Freq: Once | INTRAVENOUS | 0 refills | Status: CP
Start: 2022-03-04 — End: ?
  Administered 2022-03-04 (×3): 900 mg via INTRAVENOUS

## 2022-03-04 MED ORDER — DEXAMETHASONE 4 MG PO TAB
2 mg | Freq: Once | ORAL | 0 refills | Status: CP
Start: 2022-03-04 — End: ?
  Administered 2022-03-04: 15:00:00 2 mg via ORAL

## 2022-03-04 MED ORDER — DIPHENHYDRAMINE HCL 25 MG PO CAP
25 mg | Freq: Once | ORAL | 0 refills | Status: CP
Start: 2022-03-04 — End: ?
  Administered 2022-03-04: 15:00:00 25 mg via ORAL

## 2022-03-04 NOTE — Progress Notes
CHEMO NOTE  Verified chemo consent signed and in chart.    Blood return positive via: Port (Single)    BSA and dose double checked (agree with orders as written) with: yes     Labs/applicable tests checked: CBC and Comprehensive Metabolic Panel (CMP)    Chemo regimen: Drug/cycle/day G315V7 Rapid Rituxan    Rate verified and armband double check with second RN: yes    Patient education offered and stated understanding. Denies questions at this time.      Patient arrived to CC treatment ambulatory. Patient was seen in exam where all questions and concerns were addressed. Port accessed with positive blood return and labs drawn. Patient received treatment as ordered without incidence. Patient's port flushed and deaccessed per protocol. Patient discharged ambulatory with no questions or concerns.

## 2022-03-04 NOTE — Patient Instructions
Call Immediately to report the following:  Uncontrolled nausea and/or vomiting, uncontrolled pain, or unusual bleeding.  Temperature of 100.4 F or greater and/or any sign/symptom of infection (redness, warmth, tenderness)  Painful mouth or difficulty swallowing  Red, cracked, or painful hands and/or feet  Diarrhea   Swelling of arms or legs  Rash    Important Phone Numbers:  [facility] Main Number (answered 24 hours a day) [phone]  Cancer Center Scheduling (appointments) [phone] or [phone]  [facility] (for nutritional supplements) [phone]        Port Maintenance - If you have a port, it should be flushed every 6-8 weeks when not in use.  Please check with your MD, nurse, or the scheduler.

## 2022-03-04 NOTE — Progress Notes
Name: Robert Williams          MRN: [id number]      DOB: 07-31-1945      AGE: 76 y.o.   DATE OF SERVICE: 03/04/2022    Subjective:             Reason for Visit:  Follow Up      Robert Williams is a 76 y.o. male.      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  Presented August 2020 with worsening left supraclavicular adenopathy.  Initial chest x-ray imaging showed pathologically enlarged nodes.  2.  01/13/2019 CT scans of the neck and chest showed bilateral cervical and supraclavicular adenopathy.  3.  01/17/2019 FNA of supraclavicular node revealed CD5-positive monoclonal B-cell population consistent with mantle cell lymphoma.  FISH for t(11;14) was positive, confirming the diagnosis.  4.  02/07/2019 PET/CT revealed hypermetabolic adenopathy above and below the diaphragm with no extranodal sites of disease.  Staging bone marrow biopsy was negative.  5.  Sept 2020 - March 2021 BR x 6 with CR.  6.  Rituximab maintenance initiated 08/2019    Interim History:  Doing well.  Has been dancing a lot and enjoying himself.  No new or concerning issues.  Denies new or progressive adenopathy, fevers, drenching night sweats or unintentional weight loss.  No issues with rituximab.  No interim fevers or infections.    I have reviewed and updated the past medical, social and family histories in the history section and they are up to date as of this visit.    I have extensively reviewed the laboratory, pathology and radiology, both internal and external, and the key findings are summarized above.         Review of Systems   All other systems reviewed and are negative.        Objective:         ? aspirin 325 mg tablet Take 1 Tab by mouth daily.   ? losartan-hydrochlorothiazide (HYZAAR) 50-12.5 mg tablet Take one tablet by mouth daily.     Vitals:    03/04/22 0956 03/04/22 0957   BP: 122/65    BP Source: Arm, Right Upper Arm, Right Upper   Pulse: 79    Temp: 36.7 ?C (98.1 ?F)    Resp: 16    SpO2: 97%    O2 Device:  None (Room air)   TempSrc: Oral Oral   PainSc: Zero Zero   Weight: 111.5 kg (245 lb 14.4 oz) 111.5 kg (245 lb 14.4 oz)     Body mass index is 33.38 kg/m?[name]     Pain Score: Zero       Fatigue Scale: 0-None    Pain Addressed:  N/A    Patient Evaluated for a Clinical Trial: Patient not eligible for a treatment trial (including not needing treatment, needs palliative care, in remission).     [place] Cooperative Oncology Group performance status is 0, Fully active, able to carry on all pre-disease performance without restriction.     Physical Exam  Vitals and nursing note reviewed.   Constitutional:  General: He is not in acute distress.  HENT:      Head: Normocephalic and atraumatic.   Eyes:      General: No scleral icterus.  Cardiovascular:      Rate and Rhythm: Normal rate and regular rhythm.   Pulmonary:      Effort: Pulmonary effort is normal.      Breath sounds: Normal breath sounds.   Abdominal:      General: There is no distension.      Palpations: Abdomen is soft. There is no mass.   Musculoskeletal:         General: Normal range of motion.      Cervical back: Neck supple.   Lymphadenopathy:      Comments:   No palpable cervical, supraclavicular, axillary or inguinal adenopathy.   Skin:     General: Skin is warm.      Findings: No rash.   Neurological:      Mental Status: He is alert.               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 Stage III mantle cell lymphoma  2. 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 Hypertension  4. Hyperlipidemia  5. ECOG PS 0    Plan:  He is doing extraordinarily well.  Remains in a clinical complete remission and tolerance of rituximab has been excellent.    Continue rituximab 375 mg/m? IV every 2 months for 3 years with C16 D1 = 03/04/2022.  He will receive today's dose and 2 additional doses and this will complete his planned 3 years of therapy.  Monitor immunoglobulins and CD4 count every 6 months.  Restage annually.  His next CT scans of the chest abdomen pelvis with contrast would routinely be due in late winter 2024.  We will schedule these exams concurrent with his last dose of rituximab in late January or early February.  Due to his history of lymphoma, he has an elevated risk of infections and secondary malignancies.  He additionally should have aggressive age-appropriate cancer screening.  He is up-to-date on all indicated coronavirus vaccines, has received his Shingrix vaccine, and is up-to-date on high-dose pneumococcal vaccinations.    RTC with APP in 2 months, with me in 4 months, or sooner should new or concerning symptoms develop.    I have reviewed the diagnostic and treatment plan in detail with the patient and he is in agreement with the above approach.

## 2022-03-26 ENCOUNTER — Encounter: Admit: 2022-03-26 | Discharge: 2022-03-26 | Payer: MEDICARE

## 2022-04-02 ENCOUNTER — Encounter: Admit: 2022-04-02 | Discharge: 2022-04-02 | Payer: MEDICARE

## 2022-04-08 ENCOUNTER — Encounter: Admit: 2022-04-08 | Discharge: 2022-04-08 | Payer: MEDICARE

## 2022-04-08 ENCOUNTER — Ambulatory Visit: Admit: 2022-04-08 | Discharge: 2022-04-08 | Payer: MEDICARE

## 2022-04-08 DIAGNOSIS — R7982 Elevated C-reactive protein (CRP): Secondary | ICD-10-CM

## 2022-04-27 ENCOUNTER — Encounter: Admit: 2022-04-27 | Discharge: 2022-04-27 | Payer: MEDICARE

## 2022-04-29 ENCOUNTER — Encounter: Admit: 2022-04-29 | Discharge: 2022-04-29 | Payer: MEDICARE

## 2022-04-29 DIAGNOSIS — E782 Mixed hyperlipidemia: Secondary | ICD-10-CM

## 2022-04-29 DIAGNOSIS — C61 Malignant neoplasm of prostate: Secondary | ICD-10-CM

## 2022-04-29 DIAGNOSIS — E785 Hyperlipidemia, unspecified: Secondary | ICD-10-CM

## 2022-04-29 DIAGNOSIS — C8311 Mantle cell lymphoma, lymph nodes of head, face, and neck: Secondary | ICD-10-CM

## 2022-04-29 DIAGNOSIS — I10 Essential (primary) hypertension: Secondary | ICD-10-CM

## 2022-04-29 LAB — COMPREHENSIVE METABOLIC PANEL
ALBUMIN: 4.1 g/dL — ABNORMAL LOW (ref 3.5–5.0)
BLD UREA NITROGEN: 15 mg/dL (ref 7–25)
CALCIUM: 9.4 mg/dL (ref 8.5–10.6)
CHLORIDE: 102 MMOL/L (ref 98–110)
CREATININE: 1 mg/dL — ABNORMAL HIGH (ref 0.4–1.24)
GLUCOSE,PANEL: 120 mg/dL — ABNORMAL HIGH (ref 70–100)
POTASSIUM: 4.1 MMOL/L (ref 3.5–5.1)
SODIUM: 139 MMOL/L (ref 137–147)
TOTAL BILIRUBIN: 0.6 mg/dL (ref 0.3–1.2)
TOTAL PROTEIN: 6.3 g/dL (ref 6.0–8.0)

## 2022-04-29 LAB — LIPID PROFILE
CHOLESTEROL: 273 mg/dL — ABNORMAL HIGH (ref <200)
HDL: 55 mg/dL (ref >40)
LDL: 192 mg/dL — ABNORMAL HIGH (ref <100)
NON HDL CHOLESTEROL: 218 mg/dL
TRIGLYCERIDES: 174 mg/dL — ABNORMAL HIGH (ref <150)
VLDL: 35 mg/dL

## 2022-04-29 LAB — CBC AND DIFF
RBC COUNT: 4.6 M/UL (ref 4.4–5.5)
WBC COUNT: 7.2 K/UL (ref 4.5–11.0)

## 2022-04-29 MED ORDER — ACETAMINOPHEN 325 MG PO TAB
650 mg | Freq: Once | ORAL | 0 refills | Status: CP
Start: 2022-04-29 — End: ?
  Administered 2022-04-29: 15:00:00 650 mg via ORAL

## 2022-04-29 MED ORDER — RITUXIMAB (RITUXAN) IVPB (ULTRA RAPID INFUSION)
375 mg/m2 | Freq: Once | INTRAVENOUS | 0 refills | Status: CP
Start: 2022-04-29 — End: ?
  Administered 2022-04-29 (×3): 900 mg via INTRAVENOUS

## 2022-04-29 MED ORDER — DEXAMETHASONE 4 MG PO TAB
2 mg | Freq: Once | ORAL | 0 refills | Status: CP
Start: 2022-04-29 — End: ?
  Administered 2022-04-29: 15:00:00 2 mg via ORAL

## 2022-04-29 MED ORDER — DIPHENHYDRAMINE HCL 25 MG PO CAP
25 mg | Freq: Once | ORAL | 0 refills | Status: CP
Start: 2022-04-29 — End: ?
  Administered 2022-04-29: 15:00:00 25 mg via ORAL

## 2022-04-29 NOTE — Progress Notes
CHEMO NOTE  Verified chemo consent signed and in chart.    Blood return positive via: Port (Single)    BSA and dose double checked (agree with orders as written) with: yes     Labs/applicable tests checked: CBC and Comprehensive Metabolic Panel (CMP)    Chemo regimen: Drug/cycle/day [id number] Rapid Rituxan    Rate verified and armband double check with second RN: yes    Patient education offered and stated understanding. Denies questions at this time.    Patient arrived to CC treatment ambulatory. Patient was seen in exam where all questions and concerns were addressed. Port accessed with positive blood return and labs drawn. Patient received treatment as ordered without incidence. Patient's port flushed and deaccessed per protocol. Patient discharged ambulatory with no questions or concerns.

## 2022-04-29 NOTE — Progress Notes
Name: Robert Williams          MRN: [id number]      DOB: 07/01/1945      AGE: 76 y.o.   DATE OF SERVICE: 04/29/2022    Subjective:             Reason for Visit:  Heme/Onc Care      Robert Williams is a 76 y.o. male.      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  Presented August 2020 with worsening left supraclavicular adenopathy.  Initial chest x-ray imaging showed pathologically enlarged nodes.  2.  01/13/2019 CT scans of the neck and chest showed bilateral cervical and supraclavicular adenopathy.  3.  01/17/2019 FNA of supraclavicular node revealed CD5-positive monoclonal B-cell population consistent with mantle cell lymphoma.  FISH for t(11;14) was positive, confirming the diagnosis.  4.  02/07/2019 PET/CT revealed hypermetabolic adenopathy above and below the diaphragm with no extranodal sites of disease.  Staging bone marrow biopsy was negative.  5.  Sept 2020 - March 2021 BR x 6 with CR.  6.  Rituximab maintenance initiated 08/2019    Interim History:  Regrettably had a febrile respiratory illness in October.  He was not hospitalized and had CT scans that were normal.  COVID was negative.  He has since recovered fully.  Has been dancing a lot and enjoying himself.  No new or concerning issues.  Denies new or progressive adenopathy, fevers, drenching night sweats or unintentional weight loss.  No issues with rituximab.    I have reviewed and updated the past medical, social and family histories in the history section and they are up to date as of this visit.    I have extensively reviewed the laboratory, pathology and radiology, both internal and external, and the key findings are summarized above.           Review of Systems   Constitutional: Negative.    HENT: Negative.     Eyes: Negative.    Respiratory: Negative.     Cardiovascular: Negative.    Gastrointestinal: Negative.    Endocrine: Negative.    Genitourinary: Negative.    Musculoskeletal: Negative.    Skin: Negative.    Allergic/Immunologic: Negative.    Neurological: Negative.    Hematological: Negative.    Psychiatric/Behavioral: Negative.           Objective:          aspirin 325 mg tablet Take 1 Tab by mouth daily.    losartan-hydrochlorothiazide (HYZAAR) 50-12.5 mg tablet Take one tablet by mouth daily.       Vitals:    04/29/22 0836 04/29/22 0839   BP: 120/74    BP Source: Arm, Left Upper    Pulse: 63    Temp: 36.8 ?C (98.3 ?F)    Resp: 14    SpO2: 98%    TempSrc: Oral    PainSc: Zero Zero   Weight: 108.9 kg (240 lb)    Height: 180.3 cm (5' 11)      Body mass index is 33.47 kg/m?[name]     Pain  Score: Zero       Fatigue Scale: 0-None    Pain Addressed:  N/A    Patient Evaluated for a Clinical Trial: Patient not eligible for a treatment trial (including not needing treatment, needs palliative care, in remission).     [place] Cooperative Oncology Group performance status is 0, Fully active, able to carry on all pre-disease performance without restriction.     Physical Exam  Vitals and nursing note reviewed.   Constitutional:       General: He is not in acute distress.  HENT:      Head: Normocephalic and atraumatic.   Eyes:      General: No scleral icterus.  Cardiovascular:      Rate and Rhythm: Normal rate and regular rhythm.   Pulmonary:      Effort: Pulmonary effort is normal.      Breath sounds: Normal breath sounds.   Abdominal:      General: There is no distension.      Palpations: Abdomen is soft. There is no mass.   Musculoskeletal:         General: Normal range of motion.      Cervical back: Neck supple.   Lymphadenopathy:      Comments:   No palpable cervical, supraclavicular, axillary or inguinal adenopathy.   Skin:     General: Skin is warm.      Findings: No rash.   Neurological:      Mental Status: He is alert.               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    Continue rituximab 375 mg/m? IV every 2 months for 3 years with C17 D1 = 04/29/2022.  After day he only has 1 additional dose of treatment.  Monitor immunoglobulins and CD4 count every 6 months.  Restage annually.  His next CT scans of the chest abdomen pelvis with contrast would routinely be due in late winter 2024.  We will schedule these exams concurrent with his last dose of rituximab in late January or early February.  Due to his history of lymphoma, he has an elevated risk of infections and secondary malignancies.  He additionally should have aggressive age-appropriate cancer screening.  He is up-to-date on all indicated coronavirus vaccines, has received his Shingrix vaccine, and is up-to-date on high-dose pneumococcal vaccinations.    RTC with me in January as planned, or sooner should new or concerning symptoms develop.    I have reviewed the diagnostic and treatment plan in detail with the patient and he is in agreement with the above approach.

## 2022-05-07 ENCOUNTER — Encounter: Admit: 2022-05-07 | Discharge: 2022-05-07 | Payer: MEDICARE

## 2022-06-24 ENCOUNTER — Encounter: Admit: 2022-06-24 | Discharge: 2022-06-24 | Payer: MEDICARE

## 2022-06-24 DIAGNOSIS — C8311 Mantle cell lymphoma, lymph nodes of head, face, and neck: Secondary | ICD-10-CM

## 2022-06-24 DIAGNOSIS — C61 Malignant neoplasm of prostate: Secondary | ICD-10-CM

## 2022-06-24 DIAGNOSIS — I10 Essential (primary) hypertension: Secondary | ICD-10-CM

## 2022-06-24 DIAGNOSIS — E785 Hyperlipidemia, unspecified: Secondary | ICD-10-CM

## 2022-06-24 LAB — CBC AND DIFF
ABSOLUTE LYMPH COUNT: 0.8 K/UL — ABNORMAL LOW (ref 1.0–4.8)
ABSOLUTE NEUTROPHIL: 5.3 K/UL (ref 1.8–7.0)
HEMATOCRIT: 43 % (ref 40–50)
HEMOGLOBIN: 14 g/dL (ref 13.5–16.5)
MCV: 91 FL (ref 80–100)
RBC COUNT: 4.7 M/UL — ABNORMAL HIGH (ref 4.4–5.5)
WBC COUNT: 6.7 K/UL (ref 4.5–11.0)

## 2022-06-24 LAB — COMPREHENSIVE METABOLIC PANEL
CHLORIDE: 101 MMOL/L (ref 98–110)
GLUCOSE,PANEL: 107 mg/dL — ABNORMAL HIGH (ref 70–100)
POTASSIUM: 3.8 MMOL/L (ref 3.5–5.1)
SODIUM: 138 MMOL/L (ref 137–147)

## 2022-06-24 LAB — POC CREATININE, RAD: CREATININE, POC: 1.5 mg/dL — ABNORMAL HIGH (ref 0.4–1.24)

## 2022-06-24 MED ORDER — DIPHENHYDRAMINE HCL 25 MG PO CAP
25 mg | Freq: Once | ORAL | 0 refills | Status: CP
Start: 2022-06-24 — End: ?
  Administered 2022-06-24: 17:00:00 25 mg via ORAL

## 2022-06-24 MED ORDER — DEXAMETHASONE 4 MG PO TAB
2 mg | Freq: Once | ORAL | 0 refills | Status: CP
Start: 2022-06-24 — End: ?
  Administered 2022-06-24: 17:00:00 2 mg via ORAL

## 2022-06-24 MED ORDER — ACETAMINOPHEN 325 MG PO TAB
650 mg | Freq: Once | ORAL | 0 refills | Status: CP
Start: 2022-06-24 — End: ?
  Administered 2022-06-24: 17:00:00 650 mg via ORAL

## 2022-06-24 MED ORDER — RITUXIMAB (RITUXAN) IVPB (ULTRA RAPID INFUSION)
375 mg/m2 | Freq: Once | INTRAVENOUS | 0 refills | Status: CP
Start: 2022-06-24 — End: ?
  Administered 2022-06-24 (×3): 900 mg via INTRAVENOUS

## 2022-06-24 MED ORDER — SODIUM CHLORIDE 0.9 % IJ SOLN
50 mL | Freq: Once | INTRAVENOUS | 0 refills | Status: CP
Start: 2022-06-24 — End: ?
  Administered 2022-06-24: 15:00:00 50 mL via INTRAVENOUS

## 2022-06-24 MED ORDER — IOHEXOL 350 MG IODINE/ML IV SOLN
100 mL | Freq: Once | INTRAVENOUS | 0 refills | Status: CP
Start: 2022-06-24 — End: ?
  Administered 2022-06-24: 15:00:00 100 mL via INTRAVENOUS

## 2022-06-24 NOTE — Progress Notes
CHEMO NOTE  Verified chemo consent signed and in chart.    Blood return positive via: Port (Single and Accessed)    BSA and dose double checked (agree with orders as written) : yes    Labs/applicable tests checked: None    Chemo regimen: Drug/cycle/day P7X480  riTUXimab (RITUXAN) 900 mg in sodium chloride 0.9% (NS) 590 mL IVPB (ultra rapid infusion)    Rate verified and armband double check with second RN: yes- K. Catching, RN    Patient education offered and stated understanding. Denies questions at this time.    Patient presents to [facility] treatment for X6P537 Ultra Rapid Rituxan. No new concerns voiced. Patient seen in clinic prior to appointment time by Dr. Heber Carolina, see clinic note for assessment details. Port flushed and positive for blood return. Labs drawn without difficulty. Ok to treat per Dr. Heber Carolina. Premeds given per treatment plan. Rituxan given without incident. Port flushed and de-accessed per protocol. All questions and concerns addressed. Patient left CC treatment ambulatory in stable condition.

## 2022-06-24 NOTE — Patient Instructions
Call Immediately to report the following:  Uncontrolled nausea and/or vomiting, uncontrolled pain, or unusual bleeding.  Temperature of 100.4 F or greater and/or any sign/symptom of infection (redness, warmth, tenderness)  Painful mouth or difficulty swallowing  Red, cracked, or painful hands and/or feet  Diarrhea   Swelling of arms or legs  Rash    Important Phone Numbers:  [facility] Main Number (answered 24 hours a day) [phone]  Cancer Center Scheduling (appointments) [phone]  [facility] (for nutritional supplements) [phone]        Port Maintenance - If you have a port, it should be flushed every 6-8 weeks when not in use.  Please check with your MD, nurse, or the scheduler.

## 2022-06-24 NOTE — Progress Notes
Name: Robert Williams          MRN: [id number]      DOB: 1945/08/16      AGE: 77 y.o.   DATE OF SERVICE: 06/24/2022    Subjective:             Reason for Visit:  Follow Up      Robert Williams is a 77 y.o. male.      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  Presented August 2020 with worsening left supraclavicular adenopathy.  Initial chest x-ray imaging showed pathologically enlarged nodes.  2.  01/13/2019 CT scans of the neck and chest showed bilateral cervical and supraclavicular adenopathy.  3.  01/17/2019 FNA of supraclavicular node revealed CD5-positive monoclonal B-cell population consistent with mantle cell lymphoma.  FISH for t(11;14) was positive, confirming the diagnosis.  4.  02/07/2019 PET/CT revealed hypermetabolic adenopathy above and below the diaphragm with no extranodal sites of disease.  Staging bone marrow biopsy was negative.  5.  Sept 2020 - March 2021 BR x 6 with CR.  6.  Rituximab maintenance every 2 months for 3 years completed January 2024 with CR.      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  He is working full time.  He has been walking 30 minutes every day in his local [employer].  Denies new or progressive adenopathy, fevers, drenching night sweats or unintentional weight loss.    I have reviewed and updated the past medical, social and family histories in the history section and they are up to date as of this visit.    I have extensively reviewed the laboratory, pathology and radiology, both internal and external, and the key findings are summarized above.           Review of Systems   All other systems reviewed and are negative.        Objective:          aspirin 325 mg tablet Take 1 Tab by mouth daily.    losartan-hydrochlorothiazide (HYZAAR) 50-12.5 mg tablet Take one tablet by mouth daily.       Vitals:    06/24/22 1022 06/24/22 1024   BP: (!) 144/83    BP Source: Arm, Left Upper    Pulse: 78    Temp: 36.7 ?C (98 ?F)    Resp: 16    SpO2: 98%    O2 Device:  None (Room air)   TempSrc: Oral Oral   PainSc: Zero Zero   Weight: 113.1 kg (249 lb 6.4 oz)      Body mass index is 34.78 kg/m?[name]     Pain Score: Zero       Fatigue Scale: 0-None    Pain Addressed:  N/A    Patient Evaluated for a Clinical Trial: Patient not eligible for a treatment trial (including not needing treatment, needs palliative care, in remission).     [place] Cooperative Oncology Group performance status is 0, Fully active, able to carry  on all pre-disease performance without restriction.     Physical Exam  Vitals and nursing note reviewed.   Constitutional:       General: He is not in acute distress.  HENT:      Head: Normocephalic and atraumatic.   Eyes:      General: No scleral icterus.  Cardiovascular:      Rate and Rhythm: Normal rate and regular rhythm.   Pulmonary:      Effort: Pulmonary effort is normal.      Breath sounds: Normal breath sounds.   Abdominal:      General: There is no distension.      Palpations: Abdomen is soft. There is no mass.   Musculoskeletal:         General: Normal range of motion.      Cervical back: Neck supple.   Lymphadenopathy:      Comments:   No palpable cervical, supraclavicular, axillary or inguinal adenopathy.   Skin:     General: Skin is warm.      Findings: No rash.   Neurological:      Mental Status: He is alert.               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  This is excellent news.  From a mantle cell perspective, he remains in a clinical complete remission and tolerance of rituximab has been excellent.   Robert Williams is having some persistent fatigue since he was ill back in the fall.  He has recovered, but does still have some congestion, anosmia, and is more fatigued than he typically would be.  Fortunately he does not have any evidence of an active infection right now and per above his lymphoma remains in complete remission.  He is exercising regularly and I encouraged him to keep this up.  I am not sure there is a specific intervention that would need to be undertaken.  He has had a bit of persistent mild hyperglycemia and is planning to get back in with Dr. Herschell Dimes.  I do think that evaluating for possible diabetes and checking his thyroid locally would be a good idea.  Continue rituximab 375 mg/m? IV every 2 months for 3 years with C18 D1 = 06/24/2022.  This is his last planned dose of maintenance therapy.  Monitor immunoglobulins and CD4 count every 6 months.  We will proceed with active surveillance after today's dose of rituximab.  Restage annually.  His next CT scans of the chest abdomen pelvis with contrast will be due in 1 year.    Due to his history of lymphoma, he has an elevated risk of infections and secondary malignancies.  He additionally should have aggressive age-appropriate cancer screening.  He is up-to-date on all indicated coronavirus vaccines, has received his Shingrix vaccine, and is up-to-date on high-dose pneumococcal vaccinations.  I do recommend an RSV vaccine and he will pursue this locally.  RTC with me APP in 6 months, with me in 1 year, or sooner should new or concerning symptoms develop.    I have reviewed the diagnostic and treatment plan in detail with the patient and he  is in agreement with the above approach.

## 2022-06-25 ENCOUNTER — Encounter: Admit: 2022-06-25 | Discharge: 2022-06-25 | Payer: MEDICARE

## 2022-07-03 ENCOUNTER — Encounter: Admit: 2022-07-03 | Discharge: 2022-07-03 | Payer: MEDICARE

## 2023-06-25 ENCOUNTER — Encounter: Admit: 2023-06-25 | Discharge: 2023-06-25 | Payer: MEDICARE

## 2023-06-25 DIAGNOSIS — C8311 Mantle cell lymphoma, lymph nodes of head, face, and neck: Secondary | ICD-10-CM

## 2023-06-25 MED ADMIN — IOHEXOL 350 MG IODINE/ML IV SOLN [81210]: 100 mL | INTRAVENOUS | @ 18:00:00 | Stop: 2023-06-25 | NDC 00407141491

## 2023-06-25 MED ADMIN — SODIUM CHLORIDE 0.9 % IJ SOLN [7319]: 50 mL | INTRAVENOUS | @ 18:00:00 | Stop: 2023-06-25 | NDC 00409488850

## 2023-06-25 NOTE — Progress Notes
Name: Robert Williams          MRN: [id number]      DOB: 06-18-1945      AGE: 78 y.o.   DATE OF SERVICE: 06/25/2023    Subjective:             Reason for Visit:  Cancer Follow up      Robert Williams is a 78 y.o. male.      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  Presented August 2020 with worsening left supraclavicular adenopathy.  Initial chest x-ray imaging showed pathologically enlarged nodes.  2.  01/13/2019 CT scans of the neck and chest showed bilateral cervical and supraclavicular adenopathy.  3.  01/17/2019 FNA of supraclavicular node revealed CD5-positive monoclonal B-cell population consistent with mantle cell lymphoma.  FISH for t(11;14) was positive, confirming the diagnosis.  4.  02/07/2019 PET/CT revealed hypermetabolic adenopathy above and below the diaphragm with no extranodal sites of disease.  Staging bone marrow biopsy was negative.  5.  Sept 2020 - March 2021 BR x 6 with CR.  6.  Rituximab maintenance every 2 months for 3 years completed January 2024 with CR.      Interim History:    Robert Williams is doing well today.  He denies any fever or chills.  No constitutional symptoms: No drenching night sweats, no unintentional weight loss.  He continues to maintain his activity and mobility.  He goes weekly with his wife for dancing.  He has good appetite without any GI symptoms.  He does still have his port in place and he is wondering about if this should come out or stay in.  It does not bother him to have it in place.  He continues to have flushes every 6 months.    I have reviewed and updated the past medical, social and family histories in the history section and they are up to date as of this visit.    I have extensively reviewed the laboratory, pathology and radiology, both internal and external, and the key findings are summarized above.           Review of Systems   All other systems reviewed and are negative.        Objective:          aspirin 325 mg tablet Take 1 Tab by mouth daily.    atorvastatin (LIPITOR) 40 mg tablet     latanoprost (XALATAN) 0.005 % ophthalmic solution     levothyroxine (SYNTHROID) 112 mcg tablet     losartan-hydrochlorothiazide (HYZAAR) 50-12.5 mg tablet Take one tablet by mouth daily.       Vitals:    06/25/23 1355   BP: 104/44   BP Source: Arm, Left Upper   Pulse: 97   Temp: 36.4 ?C (97.6 ?F)   Resp: 16   SpO2: 96%   O2 Device: None (Room air)   TempSrc: Oral   PainSc: Zero   Weight: 110.6 kg (243 lb 12.8 oz)     Body mass index is 34 kg/m?[name]     Pain Score: Zero  Fatigue Scale: 0-None    Pain Addressed:  N/A    Patient Evaluated for a Clinical Trial: Patient not eligible for a treatment trial (including not needing treatment, needs palliative care, in remission).     [place] Cooperative Oncology Group performance status is 0, Fully active, able to carry on all pre-disease performance without restriction.     Physical Exam  Vitals and nursing note reviewed.   Constitutional:       General: He is not in acute distress.  HENT:      Head: Normocephalic and atraumatic.   Eyes:      General: No scleral icterus.  Cardiovascular:      Rate and Rhythm: Normal rate and regular rhythm.   Pulmonary:      Effort: Pulmonary effort is normal.      Breath sounds: Normal breath sounds.   Abdominal:      General: There is no distension.      Palpations: Abdomen is soft. There is no mass.   Musculoskeletal:         General: Normal range of motion.      Cervical back: Neck supple.   Lymphadenopathy:      Comments:   No palpable cervical, supraclavicular, axillary or inguinal adenopathy.   Skin:     General: Skin is warm.      Findings: No rash.   Neurological:      Mental Status: He is alert.               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  This is excellent news.  From a mantle cell perspective, he remains in a clinical complete remission and tolerance of rituximab has been excellent.   Monitor immunoglobulins and CD4 count every 6 months.  We will proceed with active surveillance.  Restage annually.  His next CT scans of the chest abdomen pelvis with contrast will be due in 1 year.    Due to his history of lymphoma, he has an elevated risk of infections and secondary malignancies.  He additionally should have aggressive age-appropriate cancer screening.  He is up-to-date on all indicated coronavirus vaccines, has received his Shingrix vaccine, and is up-to-date on high-dose pneumococcal vaccinations.  I do recommend an RSV vaccine and he will pursue this locally.  He is wondering about if he should get his port removed.  It is ultimately up to him.  Do not anticipate that he will need any systemic therapy in the near future, overall is not bothering him very much so we will continue with port flushes but he is always welcome to have it removed should he decide this is necessary.  RTC with APP in 6 months, with Dr. Paulene Floor in 1 year, or sooner should new or concerning symptoms develop.    I have reviewed the diagnostic and treatment plan in detail with the patient and he is in agreement with the above approach.     Patient seen and discussed with Dr. Vira Agar, M.D.  Hematology / Oncology Fellow   PGY-6

## 2023-06-25 NOTE — Progress Notes
I have seen, examined, and participated in all medical decision making regarding this patient's care.  I personally reviewed all of their laboratory reports and pertinent imaging as detailed above in the resident's notes.  I agree with the resident documentation as detailed below with the following additions and/or exceptions:    CT scans reviewed personally and show ongoing CR.  This is excellent news.    Plan:  Continue active surveillance.  Repeat CT scans the chest abdomen pelvis January 2026, or sooner if there is clinical concern for progression.  RTC with APP in 6 months, with me in 1 year, or sooner should new or concerning issues arise.

## 2023-12-24 ENCOUNTER — Encounter: Admit: 2023-12-24 | Discharge: 2023-12-24 | Payer: MEDICARE

## 2023-12-24 DIAGNOSIS — C8311 Mantle cell lymphoma, lymph nodes of head, face, and neck: Principal | ICD-10-CM

## 2023-12-24 LAB — COMPREHENSIVE METABOLIC PANEL
~~LOC~~ BKR ALBUMIN: 4.4 g/dL — ABNORMAL LOW (ref 3.5–5.0)
~~LOC~~ BKR ALK PHOSPHATASE: 74 U/L (ref 25–110)
~~LOC~~ BKR ALT: 12 U/L (ref 7–56)
~~LOC~~ BKR ANION GAP: 10 10*3/uL (ref 3–12)
~~LOC~~ BKR AST: 12 U/L (ref 7–40)
~~LOC~~ BKR CALCIUM: 9.4 mg/dL (ref 8.5–10.6)
~~LOC~~ BKR CHLORIDE: 103 mmol/L — ABNORMAL LOW (ref 98–110)
~~LOC~~ BKR CO2: 27 mmol/L (ref 21–30)
~~LOC~~ BKR CREATININE: 0.9 mg/dL (ref 0.40–1.24)
~~LOC~~ BKR GLOMERULAR FILTRATION RATE (GFR): >60 mL/min (ref >60–0.80)
~~LOC~~ BKR POTASSIUM: 4.1 mmol/L (ref 3.5–5.1)
~~LOC~~ BKR SODIUM, SERUM: 140 mmol/L (ref 137–147)
~~LOC~~ BKR TOTAL BILIRUBIN: 0.7 mg/dL (ref 0.2–1.3)
~~LOC~~ BKR TOTAL PROTEIN: 6.5 g/dL (ref 6.0–8.0)

## 2023-12-24 LAB — CBC AND DIFF
~~LOC~~ BKR ABSOLUTE BASO COUNT: 0.1 10*3/uL (ref 0.00–0.20)
~~LOC~~ BKR ABSOLUTE EOS COUNT: 0.1 10*3/uL (ref 0.00–0.45)
~~LOC~~ BKR WBC COUNT: 8.3 10*3/uL (ref 4.50–11.00)

## 2023-12-24 LAB — CD4 ABSOLUTE CELL COUNT
~~LOC~~ BKR CD4 COUNT: 403 {cells}/uL — ABNORMAL LOW (ref 491–1734)
~~LOC~~ BKR CD4%: 33 % (ref 31.5–62.4)

## 2023-12-24 NOTE — Progress Notes
 Name: Robert Williams          MRN: [id number]      DOB: December 27, 1945      AGE: 78 y.o.   DATE OF SERVICE: 12/24/2023    Subjective:             Reason for Visit:  Follow Up      Robert Williams is a 78 y.o. male.      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 Rosan with mantle cell lymphoma.  He completed 6 cycles of bendamustine  and Rituxan .  He completed maintenance Rituxan  in January 2024.  He obtained a CR.  He is here for labs and clinical evaluation.     Subjective  He is doing well.  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 Kyung, up-to-date  Up-to-date with vaccines.  He is unsure if he took blood pressure medicine this morning    HPI  1.  Presented August 2020 with worsening left supraclavicular adenopathy.  Initial chest x-ray imaging showed pathologically enlarged nodes.  2.  01/13/2019 CT scans of the neck and chest showed bilateral cervical and supraclavicular adenopathy.  3.  01/17/2019 FNA of supraclavicular node revealed CD5-positive monoclonal B-cell population consistent with mantle cell lymphoma.  FISH for t(11;14) was positive, confirming the diagnosis.  4.  02/07/2019 PET/CT revealed hypermetabolic adenopathy above and below the diaphragm with no extranodal sites of disease.  Staging bone marrow biopsy was negative.  5.  Sept 2020 - March 2021 BR x 6 with CR.  6.  Rituximab  maintenance     He is alone.  He is married.     Review of Systems      Objective:          aspirin 325 mg tablet Take 1 Tab by mouth daily.    atorvastatin (LIPITOR) 40 mg tablet     latanoprost (XALATAN) 0.005 % ophthalmic solution     levothyroxine (SYNTHROID) 112 mcg tablet     losartan-hydrochlorothiazide (HYZAAR) 50-12.5 mg tablet Take one tablet by mouth daily.     Vitals:    12/24/23 1048   BP: (!) 145/77   BP Source: Arm, Left Upper   Pulse: 71   Temp: 36.6 ?C (97.9 ?F)   Resp: 20   SpO2: 98%   O2 Device: None (Room air)   TempSrc: Oral   PainSc: Zero   Weight: 108.9 kg (240 lb)       Body mass index is 33.47 kg/m?SABRA     Pain Score: Zero       Pain Addressed:  N/A  [place] Cooperative Oncology Group performance status is 0, Fully active, able to carry on all pre-disease performance without restriction.SABRA     Physical Exam  Vitals reviewed.   Constitutional:       Appearance: He is well-developed.   HENT:      Head: Normocephalic.   Cardiovascular:      Rate and Rhythm: Normal rate and regular  rhythm.   Pulmonary:      Effort: Pulmonary effort is normal.      Breath sounds: Normal breath sounds.   Abdominal:      Palpations: Abdomen is soft.   Musculoskeletal:         General: Normal range of motion.      Cervical back: Normal range of motion.   Lymphadenopathy:      Cervical: No cervical adenopathy.      Upper Body:      Right upper body: No supraclavicular or axillary adenopathy.      Left upper body: No supraclavicular or axillary adenopathy.   Skin:     General: Skin is warm and dry.   Neurological:      Mental Status: He is alert and oriented to person, place, and time.          CBC w/Diff    Lab Results   Component Value Date/Time    WBC 8.30 12/24/2023 10:14 AM    RBC 4.98 12/24/2023 10:14 AM    HGB 15.2 12/24/2023 10:14 AM    HCT 43.7 12/24/2023 10:14 AM    MCV 87.7 12/24/2023 10:14 AM    MCH 30.4 12/24/2023 10:14 AM    MCHC 34.7 12/24/2023 10:14 AM    RDW 14.9 12/24/2023 10:14 AM    PLTCT 280 12/24/2023 10:14 AM    MPV 7.9 12/24/2023 10:14 AM    Lab Results   Component Value Date/Time    NEUT 77.0 12/24/2023 10:14 AM    ANC 6.40 12/24/2023 10:14 AM    LYMA 11.9 (L) 12/24/2023 10:14 AM    ALC 1.00 12/24/2023 10:14 AM    MONA 9.2 12/24/2023 10:14 AM    AMC 0.80 12/24/2023 10:14 AM    EOSA 0.9 12/24/2023 10:14 AM    AEC 0.10 12/24/2023 10:14 AM    BASA 1.0 12/24/2023 10:14 AM    ABC 0.10 12/24/2023 10:14 AM        Comprehensive Metabolic Profile    Lab Results   Component Value Date/Time    NA 140 06/25/2023 11:05 AM    K 4.0 06/25/2023 11:05 AM    CL 102 06/25/2023 11:05 AM    CO2 26 06/25/2023 11:05 AM    GAP 12 06/25/2023 11:05 AM    BUN 16 06/25/2023 11:05 AM    CR 1.10 06/25/2023 11:05 AM    GLU 109 (H) 06/25/2023 11:05 AM    Lab Results   Component Value Date/Time    CA 9.4 06/25/2023 11:05 AM    PO4 2.8 01/27/2019 09:14 AM    ALBUMIN 4.6 06/25/2023 11:05 AM    TOTPROT 7.0 06/25/2023 11:05 AM    ALKPHOS 81 06/25/2023 11:05 AM    AST 18 06/25/2023 11:05 AM    ALT 21 06/25/2023 11:05 AM    TOTBILI 0.8 06/25/2023 11:05 AM    GFR >60 06/25/2023 11:05 AM    GFRAA >60 04/24/2020 09:35 AM        Assessment and Plan:    Stage III mantle cell lymphoma.  He completed 6 cycles of BR followed by maintenance rituximab  (completed January 2024) and obtained CR.   He remains in CR according to scans completed in January 2025.  He will see Dr. Rosan in January 2026 after scans.  He is doing well and has no signs or symptoms of disease progression.  He would like to keep his port in for now.  He will be flushed today.  He  will contact us  if he wants it removed.  History of early-stage prostate cancer treated with prostatectomy and radiation therapy in 2003.  He will continue to follow with his primary doctor, Dr. Kyung.  He is up-to-date.  He is up-to-date with age-appropriate vaccines.  He plans to get flu and COVID-vaccine this fall.  CD4 count was able to be added on today but we will check immunoglobulins in 6 months.

## 2024-03-01 ENCOUNTER — Encounter: Admit: 2024-03-01 | Discharge: 2024-03-01 | Payer: MEDICARE

## 2024-06-23 ENCOUNTER — Encounter: Admit: 2024-06-23 | Discharge: 2024-06-23 | Payer: MEDICARE

## 2024-06-23 DIAGNOSIS — C8311 Mantle cell lymphoma, lymph nodes of head, face, and neck: Principal | ICD-10-CM

## 2024-06-23 MED ORDER — BRUKINSA 160 MG PO TAB
320 mg | ORAL_TABLET | Freq: Every day | ORAL | 3 refills | 30.00000 days | Status: AC
Start: 2024-06-23 — End: ?

## 2024-06-23 NOTE — Progress Notes [1]
 Name: Robert Williams          MRN: [id number]      DOB: [birth year]      AGE: 79 y.o.   DATE OF SERVICE: 06/23/2024    Subjective:             Reason for Visit:  Cancer Follow up      Robert Williams is a 79 y.o. male.      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  Presented August 2020 with worsening left supraclavicular adenopathy.  Initial chest x-ray imaging showed pathologically enlarged nodes.  2.  01/13/2019 CT scans of the neck and chest showed bilateral cervical and supraclavicular adenopathy.  3.  01/17/2019 FNA of supraclavicular node revealed CD5-positive monoclonal B-cell population consistent with mantle cell lymphoma.  FISH for t(11;14) was positive, confirming the diagnosis.  4.  02/07/2019 PET/CT revealed hypermetabolic adenopathy above and below the diaphragm with no extranodal sites of disease.  Staging bone marrow biopsy was negative.  5.  Sept 2020 - March 2021 BR x 6 with CR.  6.  Rituximab  maintenance every 2 months for 3 years completed January 2024 with CR.      Interim History:  Lost a little over 10 pounds.  He has been eating reasonably well, but does note some mild epigastric discomfort.  Working full-time and walking regularly.  His performance status remains excellent.  Denies new or progressive adenopathy, fevers, drenching night sweats or unintentional weight loss.    I have reviewed and updated the past medical, social and family histories in the history section and they are up to date as of this visit.    I have extensively reviewed the laboratory, pathology and radiology, both internal and external, and the key findings are summarized above.       Review of Systems   All other systems reviewed and are negative.      Objective   Objective:          aspirin 325 mg tablet Take 1 Tab by mouth daily. (Patient taking differently: Take 81 mg by mouth daily.)    atorvastatin (LIPITOR) 40 mg tablet     latanoprost (XALATAN) 0.005 % ophthalmic solution     levothyroxine (SYNTHROID) 112 mcg tablet     losartan-hydrochlorothiazide (HYZAAR) 50-12.5 mg tablet Take one tablet by mouth daily.     Vitals:    06/23/24 1408 06/23/24 1409   BP:  123/59   BP Source:  Arm, Left Upper   Pulse:  93   Temp:  36.5 ?C (97.7 ?F)   Resp:  18   SpO2:  98%   O2 Device: None (Room air)    TempSrc:  Oral   PainSc:  Zero   Weight:  105.1 kg (231 lb 12.8 oz)   Height:  182.7 cm (5' 11.93)     Body mass index is 31.5 kg/m?SABRA     Pain Score: Zero       Fatigue Scale: 0-None    Pain Addressed:  N/A    Patient Evaluated for a Clinical Trial: Patient not  eligible for a treatment trial (including not needing treatment, needs palliative care, in remission).     [place] Cooperative Oncology Group performance status is 0, Fully active, able to carry on all pre-disease performance without restriction.       Physical Exam  Vitals and nursing note reviewed.   Constitutional:       General: He is not in acute distress.  HENT:      Head: Normocephalic and atraumatic.   Eyes:      General: No scleral icterus.  Cardiovascular:      Rate and Rhythm: Normal rate and regular rhythm.   Pulmonary:      Effort: Pulmonary effort is normal.   Abdominal:      General: There is no distension.      Palpations: Abdomen is soft. There is no mass.   Musculoskeletal:         General: Normal range of motion.      Cervical back: Neck supple.   Lymphadenopathy:      Comments:   No palpable cervical, supraclavicular, axillary or inguinal adenopathy.   Skin:     General: Skin is warm.      Findings: No rash.   Neurological:      Mental Status: He is alert.                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  These are highly suspicious for representing deposits of recurrent mantle cell lymphoma.  Marcey is remarkably stable from a symptom perspective.  He does have a bit of a symptomatic anemia that is new today and is having some mild epigastric discomfort, but otherwise is at his clinical baseline.  Optimal next course of treatment would be with a BTK inhibitor.  I would recommend he be treated with zanubrutinib  in conjunction with venetoclax.  We additionally do have a clinical trial with Zanubrutinib  plus sonrotoclax, but after discussion with the trial monitor it appears that this cohort will not be open until the third quarter of 2026.  He will not have that amount of time to wait.  Refer to GI for EUS and biopsy.  Check PET/CT.  We consented him for Zanubrutinib  and venetoclax.  I have sent in a prescription for the zanubrutinib .  He will need to wait until after he completes his PET/CT to initiate treatment.  Start Zanubrutinib  320 mg p.o. daily after he completes his PET/CT and a biopsy assuming this confirms mantle cell lymphoma.  We will arrange for chemotherapy education prior to initiation of treatment.  Plan will be to consolidate with venetoclax after he is debulked for a bit.  Due to his history of lymphoma, he has an elevated risk of infections and secondary malignancies.  He additionally should have aggressive age-appropriate cancer screening.  He is up-to-date on all indicated coronavirus vaccines, has received his Shingrix  vaccine, and is up-to-date on high-dose pneumococcal vaccinations.  I do recommend an RSV vaccine and he will pursue this locally.  RTC with APP for education, with me after biopsy and PET/CT, or sooner should  new or concerning issues arise.    I have reviewed the diagnostic and treatment plan in detail with the patient and he is in agreement with the above approach.

## 2024-06-26 ENCOUNTER — Encounter: Admit: 2024-06-26 | Discharge: 2024-06-26 | Payer: MEDICARE

## 2024-06-27 ENCOUNTER — Encounter: Admit: 2024-06-27 | Discharge: 2024-06-27 | Payer: MEDICARE

## 2024-06-28 ENCOUNTER — Encounter: Admit: 2024-06-28 | Discharge: 2024-06-28 | Payer: MEDICARE

## 2024-06-28 ENCOUNTER — Ambulatory Visit: Admit: 2024-06-28 | Discharge: 2024-06-28 | Payer: MEDICARE

## 2024-06-29 ENCOUNTER — Encounter: Admit: 2024-06-29 | Discharge: 2024-06-29 | Payer: MEDICARE

## 2024-06-29 NOTE — Progress Notes [1]
 Pharmacy Benefits Investigation    Medication name: zanubrutinib  (BRUKINSA ) 160 mg tablet  Medication status: new    The insurance requires a prior authorization for the medication. The prior authorization was submitted via CoverMyMeds. Will provide update when available from payor or within 4 business days.    PA number: [id number]      Bess Rock, [facility]  Specialty Pharmacy Patient Advocate

## 2024-06-30 ENCOUNTER — Encounter: Admit: 2024-06-30 | Discharge: 2024-06-30 | Payer: MEDICARE
